# Patient Record
Sex: Female | Born: 1995 | Race: White | Hispanic: No | Smoking: Never smoker
Health system: Southern US, Community
[De-identification: ages and names within clinical notes are randomized; demographics above are authoritative.]

## PROBLEM LIST (undated history)

## (undated) DIAGNOSIS — Z23 Encounter for immunization: Secondary | ICD-10-CM

## (undated) DIAGNOSIS — J45909 Unspecified asthma, uncomplicated: Secondary | ICD-10-CM

## (undated) HISTORY — DX: Encounter for immunization: Z23

## (undated) NOTE — *Deleted (*Deleted)
I value your feedback and entrusting us with your care. If you get a [facility] patient survey, I would appreciate you taking the time to let us know about your experience today. Thank you!  As of February 05, 2019, your lab results will be released to your MyChart immediately, before I even have a chance to see them. Please give me time to review them and contact you if there are any abnormalities. Thank you for your patience.  

---
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
## 2016-07-17 ENCOUNTER — Encounter: Payer: Self-pay | Admitting: Obstetrics and Gynecology

## 2016-07-17 ENCOUNTER — Ambulatory Visit (INDEPENDENT_AMBULATORY_CARE_PROVIDER_SITE_OTHER): Payer: Medicaid Other | Admitting: Obstetrics and Gynecology

## 2016-07-17 VITALS — BP 124/70 | Ht 66.0 in | Wt 150.0 lb

## 2016-07-17 DIAGNOSIS — Z0000 Encounter for general adult medical examination without abnormal findings: Secondary | ICD-10-CM | POA: Diagnosis not present

## 2016-07-17 DIAGNOSIS — N938 Other specified abnormal uterine and vaginal bleeding: Secondary | ICD-10-CM

## 2016-07-17 DIAGNOSIS — Z3041 Encounter for surveillance of contraceptive pills: Secondary | ICD-10-CM

## 2016-07-17 DIAGNOSIS — Z3202 Encounter for pregnancy test, result negative: Secondary | ICD-10-CM

## 2016-07-17 DIAGNOSIS — Z113 Encounter for screening for infections with a predominantly sexual mode of transmission: Secondary | ICD-10-CM

## 2016-07-17 DIAGNOSIS — Z01419 Encounter for gynecological examination (general) (routine) without abnormal findings: Secondary | ICD-10-CM

## 2016-07-17 DIAGNOSIS — Z803 Family history of malignant neoplasm of breast: Secondary | ICD-10-CM

## 2016-07-17 LAB — POCT URINE PREGNANCY: PREG TEST UR: NEGATIVE

## 2016-07-17 MED ORDER — DROSPIRENONE-ETHINYL ESTRADIOL 3-0.02 MG PO TABS: 1.0000 | ORAL_TABLET | Freq: Every day | ORAL | 11 refills | Status: DC

## 2016-07-17 NOTE — Progress Notes (Signed)
Chief Complaint  Patient presents with  . Annual Exam     HPI:      [name] is a 21 y.o. G0P0000 who LMP was Patient's last menstrual period was 07/15/2016., presents today for her annual examination.  Her menses are regular every 28-30 days, lasting 7-9 days.  Dysmenorrhea moderate, occurring first 1-2 days of flow. She does not have intermenstrual bleeding usually but she tried continuous dosing of OCPs this past month and has had BTB since. No late/missed OCPs.  Sex activity: single partner, contraception - OCP (estrogen/progesterone).  Hx of STDs: none  There is a FH of breast cancer in her mat aunt, genetic testing not done. There is no FH of ovarian cancer. The patient does do self-breast exams.  Tobacco use: The patient denies current or previous tobacco use. Alcohol use: none Exercise: moderately active  She does get adequate calcium and Vitamin D in her diet.    Past Medical History:  Diagnosis Date  . Asthma     History reviewed. No pertinent surgical history.  Family History  Problem Relation Age of Onset  . Diabetes Mother   . Hypertension Mother   . Diabetes Maternal Grandmother   . Hypertension Maternal Grandmother   . Diabetes Maternal Grandfather   . Hypertension Maternal Grandfather   . Breast cancer Maternal Aunt        ? 3040s    Social History   Social History  . Marital status: Single    Spouse name: N/A  . Number of children: N/A  . Years of education: N/A   Occupational History  . Not on file.   Social History Main Topics  . Smoking status: Never Smoker  . Smokeless tobacco: Never Used  . Alcohol use No  . Drug use: No  . Sexual activity: Yes    Birth control/ protection: Pill   Other Topics Concern  . Not on file   Social History Narrative  . No narrative on file     Current Outpatient Prescriptions:  .  doxycycline (VIBRAMYCIN) 100 MG capsule, Take 1 capsule (100 mg total) by mouth 2 (two) times daily.,  Disp: 20 capsule, Rfl: 0 .  metroNIDAZOLE (FLAGYL) 500 MG tablet, Take 1 tablet (500 mg total) by mouth 2 (two) times daily., Disp: 20 tablet, Rfl: 0 .  drospirenone-ethinyl estradiol (YAZ,GIANVI,LORYNA) 3-0.02 MG tablet, Take 1 tablet by mouth daily., Disp: 1 Package, Rfl: 11  ROS:  Review of Systems  Constitutional: Negative for fatigue, fever and unexpected weight change.  Respiratory: Negative for cough, shortness of breath and wheezing.   Cardiovascular: Negative for chest pain, palpitations and leg swelling.  Gastrointestinal: Negative for blood in stool, constipation, diarrhea, nausea and vomiting.  Endocrine: Negative for cold intolerance, heat intolerance and polyuria.  Genitourinary: Positive for vaginal bleeding. Negative for dyspareunia, dysuria, flank pain, frequency, genital sores, hematuria, menstrual problem, pelvic pain, urgency, vaginal discharge and vaginal pain.  Musculoskeletal: Negative for back pain, joint swelling and myalgias.  Skin: Negative for rash.  Neurological: Negative for dizziness, syncope, light-headedness, numbness and headaches.  Hematological: Negative for adenopathy.  Psychiatric/Behavioral: Negative for agitation, confusion, sleep disturbance and suicidal ideas. The patient is not nervous/anxious.      Objective: BP 124/70   Ht 5\' 6"  (1.676 m)   Wt 150 lb (68 kg)   LMP 07/15/2016   BMI 24.21 kg/m    Physical Exam  Constitutional: She is oriented to person, place, and time. She appears well-developed  and well-nourished.  Genitourinary: Vagina normal and uterus normal. There is no rash or tenderness on the right labia. There is no rash or tenderness on the left labia. No erythema or tenderness in the vagina. No vaginal discharge found. Right adnexum does not display mass and does not display tenderness. Left adnexum does not display mass and does not display tenderness. Cervix does not exhibit motion tenderness or polyp. Uterus is not enlarged or  tender.  Neck: Normal range of motion. No thyromegaly present.  Cardiovascular: Normal rate, regular rhythm and normal heart sounds.   No murmur heard. Pulmonary/Chest: Effort normal and breath sounds normal. Right breast exhibits no mass, no nipple discharge, no skin change and no tenderness. Left breast exhibits no mass, no nipple discharge, no skin change and no tenderness.  Abdominal: Soft. There is no tenderness. There is no guarding.  Musculoskeletal: Normal range of motion.  Neurological: She is alert and oriented to person, place, and time. No cranial nerve deficit.  Psychiatric: She has a normal mood and affect. Her behavior is normal.  Vitals reviewed.   Results: Results for orders placed or performed in visit on 07/17/16 (from the past 24 hour(s))  POCT urine pregnancy     Status: Normal   Collection Time: 07/17/16  4:39 PM  Result Value Ref Range   Preg Test, Ur Negative Negative    Assessment/Plan: Encounter for annual routine gynecological examination  Screening for STD (sexually transmitted disease) - Plan: Chlamydia/Gonococcus/Trichomonas, NAA  Encounter for surveillance of contraceptive pills - OCP change due to long menses. Rx Yaz. F/u prn.  - Plan: drospirenone-ethinyl estradiol (YAZ,GIANVI,LORYNA) 3-0.02 MG tablet  Family history of breast cancer - May qualify for genetic testing when 22.  DUB (dysfunctional uterine bleeding) - Neg UPT. Most likely due to continuous dosing of OCPs. Change OCPs anyway. F/u prn - Plan: POCT urine pregnancy             GYN counsel use and side effects of OCP's, adequate intake of calcium and vitamin D, diet and exercise     F/U  Return in about 1 year (around 07/17/2017).  Thad Osoria B. Holleigh Crihfield, PA-C 07/17/2016 4:57 PM

## 2016-07-20 LAB — CHLAMYDIA/GONOCOCCUS/TRICHOMONAS, NAA
CHLAMYDIA BY NAA: NEGATIVE
GONOCOCCUS BY NAA: NEGATIVE
TRICH VAG BY NAA: NEGATIVE

## 2016-08-21 ENCOUNTER — Ambulatory Visit (INDEPENDENT_AMBULATORY_CARE_PROVIDER_SITE_OTHER): Payer: Medicaid Other | Admitting: Obstetrics and Gynecology

## 2016-08-21 ENCOUNTER — Encounter: Payer: Self-pay | Admitting: Obstetrics and Gynecology

## 2016-08-21 VITALS — BP 128/66 | HR 93 | Ht 66.0 in | Wt 153.0 lb

## 2016-08-21 DIAGNOSIS — B9689 Other specified bacterial agents as the cause of diseases classified elsewhere: Secondary | ICD-10-CM

## 2016-08-21 DIAGNOSIS — N926 Irregular menstruation, unspecified: Secondary | ICD-10-CM

## 2016-08-21 DIAGNOSIS — N760 Acute vaginitis: Secondary | ICD-10-CM | POA: Diagnosis not present

## 2016-08-21 LAB — POCT WET PREP WITH KOH
CLUE CELLS WET PREP PER HPF POC: POSITIVE
KOH PREP POC: POSITIVE — AB
TRICHOMONAS UA: NEGATIVE
YEAST WET PREP PER HPF POC: NEGATIVE

## 2016-08-21 LAB — POCT URINE PREGNANCY: PREG TEST UR: NEGATIVE

## 2016-08-21 MED ORDER — METRONIDAZOLE 500 MG PO TABS: ORAL_TABLET | ORAL | 0 refills | Status: DC

## 2016-08-21 NOTE — Progress Notes (Signed)
Chief Complaint  Patient presents with  . Vaginitis    Discharge, itching/odor  x few weeks/has tried monistat    HPI:      [name] is a 21 y.o. G0P0000 who LMP was Patient's last menstrual period was 07/23/2016., presents today for vaginal d/c, irritation, and odor for several wks. She treated with monistat-1 a week ago with temporary relief. She has been swimming a lot. No recent abx use, no new sex partners, no new soaps/detergents. She had neg gon/chlam at 5/18 annual.  She is also late for her period. She was having BTB when I saw her a month ago at her annual after doing continuous dosing of OCPs. She had a neg UPT then. We planned to try different OCPs anyway due to long menses and pt changed pills immediately. She is due to start placebo pills this wk. She had neg UPT last wk. No late/missed pills.   There are no active problems to display for this patient.   Family History  Problem Relation Age of Onset  . Diabetes Mother   . Hypertension Mother   . Diabetes Maternal Grandmother   . Hypertension Maternal Grandmother   . Diabetes Maternal Grandfather   . Hypertension Maternal Grandfather   . Breast cancer Maternal Aunt        ? 41s    Social History   Social History  . Marital status: Single    Spouse name: N/A  . Number of children: 0  . Years of education: 12   Occupational History  . Not on file.   Social History Main Topics  . Smoking status: Never Smoker  . Smokeless tobacco: Never Used  . Alcohol use No  . Drug use: No  . Sexual activity: Yes    Birth control/ protection: Pill   Other Topics Concern  . Not on file   Social History Narrative  . No narrative on file     Current Outpatient Prescriptions:  .  drospirenone-ethinyl estradiol (YAZ,GIANVI,LORYNA) 3-0.02 MG tablet, Take 1 tablet by mouth daily., Disp: 1 Package, Rfl: 11 .  metroNIDAZOLE (FLAGYL) 500 MG tablet, Take 2 tabs BID for 1 day, Disp: 4 tablet, Rfl: 0  Review of  Systems  Constitutional: Negative for fever.  Gastrointestinal: Positive for constipation. Negative for blood in stool, diarrhea, nausea and vomiting.  Genitourinary: Negative for dyspareunia, dysuria, flank pain, frequency, hematuria, urgency, vaginal bleeding, vaginal discharge and vaginal pain.  Musculoskeletal: Negative for back pain.  Skin: Negative for rash.     OBJECTIVE:   Vitals:  BP 128/66 (BP Location: Left Arm, Patient Position: Sitting, Cuff Size: Normal)   Pulse 93   Ht 5\' 6"  (1.676 m)   Wt 153 lb (69.4 kg)   LMP 07/23/2016   BMI 24.69 kg/m   Physical Exam  Constitutional: She is oriented to person, place, and time and well-developed, well-nourished, and in no distress. Vital signs are normal.  Genitourinary: Uterus normal, cervix normal, right adnexa normal, left adnexa normal and vulva normal. Uterus is not enlarged. Cervix exhibits no motion tenderness and no tenderness. Right adnexum displays no mass and no tenderness. Left adnexum displays no mass and no tenderness. Vulva exhibits no erythema, no exudate, no lesion, no rash and no tenderness. Vagina exhibits no lesion. Thin  fishy  white and vaginal discharge found.  Neurological: She is oriented to person, place, and time.  Vitals reviewed.   Results: Results for orders placed or performed in visit on  08/21/16 (from the past 24 hour(s))  POCT urine pregnancy     Status: Normal   Collection Time: 08/21/16  9:57 AM  Result Value Ref Range   Preg Test, Ur Negative Negative  POCT Wet Prep with KOH     Status: Abnormal   Collection Time: 08/21/16 10:08 AM  Result Value Ref Range   Trichomonas, UA Negative    Clue Cells Wet Prep HPF POC pos    Epithelial Wet Prep HPF POC  Few, Moderate, Many, Too numerous to count   Yeast Wet Prep HPF POC neg    Bacteria Wet Prep HPF POC  Few   RBC Wet Prep HPF POC     WBC Wet Prep HPF POC     KOH Prep POC Positive (A) Negative     Assessment/Plan: Bacterial vaginosis -  Rx flagyl eRxd. Will RF if sx recur. F/u prn. - Plan: POCT Wet Prep with KOH, metroNIDAZOLE (FLAGYL) 500 MG tablet  Late menses - Neg UPT. Reassurance. Pt just changed OCPs. Due to start placebo pills this wk. F/u prn. - Plan: POCT urine pregnancy     Meds ordered this encounter  Medications  . metroNIDAZOLE (FLAGYL) 500 MG tablet    Sig: Take 2 tabs BID for 1 day    Dispense:  4 tablet    Refill:  0      Return if symptoms worsen or fail to improve.  Alicia B. Copland, PA-C 08/21/2016 10:08 AM

## 2016-09-06 ENCOUNTER — Telehealth: Payer: Self-pay

## 2016-09-06 ENCOUNTER — Other Ambulatory Visit: Payer: Self-pay | Admitting: Obstetrics and Gynecology

## 2016-09-06 DIAGNOSIS — N760 Acute vaginitis: Principal | ICD-10-CM

## 2016-09-06 DIAGNOSIS — B9689 Other specified bacterial agents as the cause of diseases classified elsewhere: Secondary | ICD-10-CM

## 2016-09-06 MED ORDER — METRONIDAZOLE 500 MG PO TABS: ORAL_TABLET | ORAL | 0 refills | Status: DC

## 2016-09-06 NOTE — Telephone Encounter (Signed)
Rx eRxd.  

## 2016-09-06 NOTE — Telephone Encounter (Signed)
Pt called triage line stating she needs a refill of the Metronidazole. She was seen on 6/26 and ABC said  She would call it in if needed. [facility] pharmacy.

## 2016-10-02 ENCOUNTER — Emergency Department: Payer: Medicaid Other

## 2016-10-02 ENCOUNTER — Emergency Department
Admission: EM | Admit: 2016-10-02 | Discharge: 2016-10-02 | Disposition: A | Discharge status: Home / Self Care | Payer: Medicaid Other | Attending: Emergency Medicine | Admitting: Emergency Medicine

## 2016-10-02 DIAGNOSIS — R0789 Other chest pain: Secondary | ICD-10-CM | POA: Insufficient documentation

## 2016-10-02 DIAGNOSIS — R079 Chest pain, unspecified: Secondary | ICD-10-CM | POA: Diagnosis present

## 2016-10-02 LAB — COMPREHENSIVE METABOLIC PANEL
ALT: 20 U/L (ref 14–54)
AST: 22 U/L (ref 15–41)
Albumin: 4.7 g/dL (ref 3.5–5.0)
Alkaline Phosphatase: 41 U/L (ref 38–126)
Anion gap: 9 (ref 5–15)
BUN: 13 mg/dL (ref 6–20)
CHLORIDE: 106 mmol/L (ref 101–111)
CO2: 22 mmol/L (ref 22–32)
Calcium: 9.6 mg/dL (ref 8.9–10.3)
Creatinine, Ser: 1.01 mg/dL — ABNORMAL HIGH (ref 0.44–1.00)
Glucose, Bld: 107 mg/dL — ABNORMAL HIGH (ref 65–99)
POTASSIUM: 3.4 mmol/L — AB (ref 3.5–5.1)
Sodium: 137 mmol/L (ref 135–145)
Total Bilirubin: 0.5 mg/dL (ref 0.3–1.2)
Total Protein: 7.8 g/dL (ref 6.5–8.1)

## 2016-10-02 LAB — CBC
HCT: 40.9 % (ref 35.0–47.0)
Hemoglobin: 14 g/dL (ref 12.0–16.0)
MCH: 31 pg (ref 26.0–34.0)
MCHC: 34.3 g/dL (ref 32.0–36.0)
MCV: 90.5 fL (ref 80.0–100.0)
PLATELETS: 299 10*3/uL (ref 150–440)
RBC: 4.52 MIL/uL (ref 3.80–5.20)
RDW: 13.2 % (ref 11.5–14.5)
WBC: 7.2 10*3/uL (ref 3.6–11.0)

## 2016-10-02 LAB — FIBRIN DERIVATIVES D-DIMER (ARMC ONLY): FIBRIN DERIVATIVES D-DIMER (ARMC): 75.34 (ref 0.00–499.00)

## 2016-10-02 LAB — TROPONIN I

## 2016-10-02 MED ORDER — KETOROLAC TROMETHAMINE 30 MG/ML IJ SOLN
15.0000 mg | INTRAMUSCULAR | Status: AC
  Administered 2016-10-02: 15 mg via INTRAVENOUS
  Filled 2016-10-02: qty 1

## 2016-10-02 MED ORDER — NAPROXEN 500 MG PO TABS: 500.0000 mg | ORAL_TABLET | Freq: Two times a day (BID) | ORAL | 0 refills | Status: DC

## 2016-10-02 NOTE — ED Provider Notes (Signed)
[facility] Emergency Department Provider Note  ____________________________________________  Time seen: Approximately 10:12 PM  I have reviewed the triage vital signs and the nursing notes.   HISTORY  Chief Complaint Chest Pain    HPI Jodi Chapman is a 21 y.o. female who complains of central chest pain radiating to the back which is described as sharp and worse with breathing. Not exertional, no cough. No dizziness vomiting or diaphoresis. No syncope. Rapid onset while at work at about 4 PM today, somewhat intermittent and waxing and waning in intensity over the next few hours butat times severe. No recent travel, hospitalization or surgery. No history of DVT or PE. Nonsmoker. Takes oral birth control pills.  No pain or paresthesias or weakness in any extremities. No neck pain headache vision changes. No dizziness     Past Medical History:  Diagnosis Date  . Asthma   . Immunization, viral disease    GARDASIL SERIES COMPLETE     There are no active problems to display for this patient.    Past Surgical History:  Procedure Laterality Date  . WISDOM TOOTH EXTRACTION  03/29/2014     Prior to Admission medications   Medication Sig Start Date End Date Taking? Authorizing Provider  drospirenone-ethinyl estradiol (YAZ,GIANVI,LORYNA) 3-0.02 MG tablet Take 1 tablet by mouth daily. 07/17/16   Copland, Ilona Sorrel, PA-C  metroNIDAZOLE (FLAGYL) 500 MG tablet Take 1 tab BID for 7 days 09/06/16   Copland, Ilona Sorrel, PA-C     Allergies Patient has no known allergies.   Family History  Problem Relation Age of Onset  . Diabetes Mother   . Hypertension Mother   . Diabetes Maternal Grandmother   . Hypertension Maternal Grandmother   . Diabetes Maternal Grandfather   . Hypertension Maternal Grandfather   . Breast cancer Maternal Aunt        ? 49s    Social History Social History  Substance Use Topics  . Smoking status: Never Smoker  . Smokeless  tobacco: Never Used  . Alcohol use No    Review of Systems  Constitutional:   No fever or chills.  ENT:   No sore throat. No rhinorrhea. Cardiovascular:   Positive as above chest pain without syncope. Respiratory:   No dyspnea or cough. Gastrointestinal:   Negative for abdominal pain, vomiting and diarrhea.  Musculoskeletal:   Negative for focal pain or swelling All other systems reviewed and are negative except as documented above in ROS and HPI.  ____________________________________________   PHYSICAL EXAM:  VITAL SIGNS: ED Triage Vitals  Enc Vitals Group     BP 10/02/16 2025 133/80     Pulse Rate 10/02/16 2025 75     Resp 10/02/16 2025 20     Temp 10/02/16 2025 98.6 F (37 C)     Temp Source 10/02/16 2025 Oral     SpO2 10/02/16 2025 99 %     Weight 10/02/16 2025 150 lb (68 kg)     Height 10/02/16 2025 5\' 6"  (1.676 m)     Head Circumference --      Peak Flow --      Pain Score 10/02/16 2027 7     Pain Loc --      Pain Edu? --      Excl. in GC? --     Vital signs reviewed, nursing assessments reviewed.   Constitutional:   Alert and oriented. Well appearing and in no distress. Eyes:   No scleral icterus.  EOMI. No nystagmus. No conjunctival pallor. PERRL. ENT   Head:   Normocephalic and atraumatic.   Nose:   No congestion/rhinnorhea.    Mouth/Throat:   MMM, no pharyngeal erythema. No peritonsillar mass.    Neck:   No meningismus. Full ROM Hematological/Lymphatic/Immunilogical:   No cervical lymphadenopathy. Cardiovascular:   RRR. Symmetric bilateral radial and DP pulses.  No murmurs.  Respiratory:   Normal respiratory effort without tachypnea/retractions. Breath sounds are clear and equal bilaterally. No wheezes/rales/rhonchi. Gastrointestinal:   Soft and nontender. Non distended. There is no CVA tenderness.  No rebound, rigidity, or guarding. Genitourinary:   deferred Musculoskeletal:   Normal range of motion in all extremities. No joint effusions.   No lower extremity tenderness.  No edema.Chest wall tender over the upper sternum, corresponding to the area of indicated pain. Neurologic:   Normal speech and language.  Motor grossly intact. No gross focal neurologic deficits are appreciated.  Skin:    Skin is warm, dry and intact. No rash noted.  No petechiae, purpura, or bullae.  ____________________________________________    LABS (pertinent positives/negatives) (all labs ordered are listed, but only abnormal results are displayed) Labs Reviewed  COMPREHENSIVE METABOLIC PANEL - Abnormal; Notable for the following:       Result Value   Potassium 3.4 (*)    Glucose, Bld 107 (*)    Creatinine, Ser 1.01 (*)    All other components within normal limits  CBC  TROPONIN I  FIBRIN DERIVATIVES D-DIMER ([facility] ONLY)   ____________________________________________   EKG  Interpreted by me  Date: 10/02/2016  Rate: 78  Rhythm: normal sinus rhythm  QRS Axis: normal  Intervals: normal  ST/T Wave abnormalities: normal  Conduction Disutrbances: none  Narrative Interpretation: unremarkable      ____________________________________________    RADIOLOGY  Dg Chest 2 View  Result Date: 10/02/2016 CLINICAL DATA:  Chest pain EXAM: CHEST  2 VIEW COMPARISON:  None. FINDINGS: Lungs are clear. Heart size and pulmonary vascularity are normal. No adenopathy. No pneumothorax. No bone lesions. IMPRESSION: No abnormality noted. Electronically Signed   By: Bretta BangWilliam  Woodruff III M.D.   On: 10/02/2016 20:49    ____________________________________________   PROCEDURES Procedures  ____________________________________________   INITIAL IMPRESSION / ASSESSMENT AND PLAN / ED COURSE  Pertinent labs & imaging results that were available during my care of the patient were reviewed by me and considered in my medical decision making (see chart for details).  Patient well appearing no acute distress, presents with pleuritic chest pain, somewhat  reproducible on exam. Chest wall pain versus low suspicion for pulmonary embolism. D-dimer negative and actually very low. Otherwise labs chest x-ray and vitals are all unremarkable. With normal vital signs including no hypoxia or tachycardia, very unlikely the patient would have a PE. I highly doubt an aortic injury such as dissection. No evidence of pneumothorax on the ACS or pericarditis. We'll recommend NSAIDs for pain control, follow up with primary care.      ____________________________________________   FINAL CLINICAL IMPRESSION(S) / ED DIAGNOSES  Final diagnoses:  Atypical chest pain      New Prescriptions   No medications on file     Portions of this note were generated with dragon dictation software. Dictation errors may occur despite best attempts at proofreading.    Sharman CheekStafford, Aswad Wandrey, MD 10/02/16 2216

## 2016-10-02 NOTE — ED Triage Notes (Signed)
Pt in with co midsternal chest pain that radiates to back. Denies any shob but does states pain worse on inspiration. No hx of the same, or known injury;.

## 2017-01-25 ENCOUNTER — Ambulatory Visit
Admission: EM | Admit: 2017-01-25 | Discharge: 2017-01-25 | Disposition: A | Discharge status: Home / Self Care | Payer: Medicaid Other | Attending: Family Medicine | Admitting: Family Medicine

## 2017-01-25 ENCOUNTER — Encounter: Payer: Self-pay | Admitting: *Deleted

## 2017-01-25 DIAGNOSIS — R51 Headache: Secondary | ICD-10-CM | POA: Diagnosis not present

## 2017-01-25 DIAGNOSIS — J069 Acute upper respiratory infection, unspecified: Secondary | ICD-10-CM | POA: Diagnosis not present

## 2017-01-25 DIAGNOSIS — J029 Acute pharyngitis, unspecified: Secondary | ICD-10-CM | POA: Diagnosis not present

## 2017-01-25 DIAGNOSIS — R0981 Nasal congestion: Secondary | ICD-10-CM | POA: Diagnosis present

## 2017-01-25 LAB — RAPID STREP SCREEN (MED CTR MEBANE ONLY): STREPTOCOCCUS, GROUP A SCREEN (DIRECT): NEGATIVE

## 2017-01-25 MED ORDER — AMOXICILLIN-POT CLAVULANATE 875-125 MG PO TABS: 1.0000 | ORAL_TABLET | Freq: Two times a day (BID) | ORAL | 0 refills | Status: DC

## 2017-01-25 NOTE — ED Triage Notes (Signed)
Headache, facial pain nasal drainage- bloody, sore throat, x2 days. Seen here for URI 3 weeks ago and did not completely recover. OTC meds not working.

## 2017-01-25 NOTE — ED Provider Notes (Signed)
[facility]    CSN: [id number] Arrival date & time: 01/25/17  1705     History   Chief Complaint Chief Complaint  Patient presents with  . Headache  . Facial Pain  . Sore Throat  . Nasal Congestion    HPI Jodi Chapman is a 21 y.o. female.   HPI  21 year old female accompanied by her mother presents with headache facial pain with nasal drainage sore throat for 2 days.  She was seen for a URI about 3 weeks ago and did not completely recover only to have worsened over the last couple days.  Using DayQuil and NyQuil without much success.  She does not complain specifically of a cough but more of a sinusitis type of symptoms.  States that she has been waking up in bed clothing very wet and having chills at nighttime but has not measured her fever.  She does not have a fever today.       Past Medical History:  Diagnosis Date  . Asthma   . Immunization, viral disease    GARDASIL SERIES COMPLETE    There are no active problems to display for this patient.   Past Surgical History:  Procedure Laterality Date  . WISDOM TOOTH EXTRACTION  03/29/2014    OB History    Gravida Para Term Preterm AB Living   0 0 0 0 0 0   SAB TAB Ectopic Multiple Live Births   0 0 0 0 0       Home Medications    Prior to Admission medications   Medication Sig Start Date End Date Taking? Authorizing Provider  drospirenone-ethinyl estradiol (YAZ,GIANVI,LORYNA) 3-0.02 MG tablet Take 1 tablet by mouth daily. 07/17/16  Yes Copland, Alicia B, PA-C  amoxicillin-clavulanate (AUGMENTIN) 875-125 MG tablet Take 1 tablet by mouth every 12 (twelve) hours. 01/25/17   Lutricia Feiloemer, Sherrol Vicars P, PA-C    Family History Family History  Problem Relation Age of Onset  . Diabetes Mother   . Hypertension Mother   . Diabetes Maternal Grandmother   . Hypertension Maternal Grandmother   . Diabetes Maternal Grandfather   . Hypertension Maternal Grandfather   . Breast cancer Maternal Aunt    ? 4740s    Social History Social History   Tobacco Use  . Smoking status: Never Smoker  . Smokeless tobacco: Never Used  Substance Use Topics  . Alcohol use: No  . Drug use: No     Allergies   Patient has no known allergies.   Review of Systems Review of Systems  Constitutional: Positive for activity change, chills and fatigue. Negative for fever.  HENT: Positive for congestion.   All other systems reviewed and are negative.    Physical Exam Triage Vital Signs ED Triage Vitals  Enc Vitals Group     BP 01/25/17 1809 129/80     Pulse Rate 01/25/17 1809 94     Resp 01/25/17 1809 16     Temp 01/25/17 1809 98.2 F (36.8 C)     Temp Source 01/25/17 1809 Oral     SpO2 01/25/17 1809 100 %     Weight 01/25/17 1811 130 lb (59 kg)     Height 01/25/17 1811 5\' 7"  (1.702 m)     Head Circumference --      Peak Flow --      Pain Score --      Pain Loc --      Pain Edu? --      Excl.  in GC? --    No data found.  Updated Vital Signs BP 129/80 (BP Location: Left Arm)   Pulse 94   Temp 98.2 F (36.8 C) (Oral)   Resp 16   Ht 5\' 7"  (1.702 m)   Wt 130 lb (59 kg)   SpO2 100%   BMI 20.36 kg/m   Visual Acuity Right Eye Distance:   Left Eye Distance:   Bilateral Distance:    Right Eye Near:   Left Eye Near:    Bilateral Near:     Physical Exam  Constitutional: She is oriented to person, place, and time. She appears well-developed and well-nourished.  Non-toxic appearance. She does not appear ill. No distress.  HENT:  Head: Normocephalic.  Mouth/Throat: Oropharynx is clear and moist.  Eyes: Pupils are equal, round, and reactive to light.  Neck: Normal range of motion.  Pulmonary/Chest: Effort normal and breath sounds normal.  Musculoskeletal: Normal range of motion.  Lymphadenopathy:    She has cervical adenopathy.  Neurological: She is alert and oriented to person, place, and time. She has normal strength.  Skin: Skin is warm and dry.  Psychiatric: She has a  normal mood and affect. Her behavior is normal.  Nursing note and vitals reviewed.    UC Treatments / Results  Labs (all labs ordered are listed, but only abnormal results are displayed) Labs Reviewed  RAPID STREP SCREEN (NOT [facility])  CULTURE, GROUP A STREP [facility])    EKG  EKG Interpretation None       Radiology No results found.  Procedures Procedures (including critical care time)  Medications Ordered in UC Medications - No data to display   Initial Impression / Assessment and Plan / UC Course  I have reviewed the triage vital signs and the nursing notes.  Pertinent labs & imaging results that were available during my care of the patient were reviewed by me and considered in my medical decision making (see chart for details).     Plan: 1. Test/x-ray results and diagnosis reviewed with patient 2. rx as per orders; risks, benefits, potential side effects reviewed with patient 3. Recommend supportive treatment with flonase nasal  spray daily.  Technique was described to the patient.  Because of her length of her symptoms and a worsening recently suggestive sinusitis.  Also recommended Tylenol or Motrin for chills fever or pain.  She is not improving she should follow-up with her primary care physician. 4. F/u prn if symptoms worsen or don't improve   Final Clinical Impressions(s) / UC Diagnoses   Final diagnoses:  Upper respiratory tract infection, unspecified type    ED Discharge Orders        Ordered    amoxicillin-clavulanate (AUGMENTIN) 875-125 MG tablet  Every 12 hours     01/25/17 1845       Controlled Substance Prescriptions [place] Controlled Substance Registry consulted? Not Applicable   Lutricia FeilRoemer, Kalanie Fewell P, PA-C 01/25/17 1858

## 2017-01-28 LAB — CULTURE, GROUP A STREP (THRC)

## 2017-04-08 ENCOUNTER — Emergency Department
Admission: EM | Admit: 2017-04-08 | Discharge: 2017-04-09 | Disposition: A | Discharge status: Home / Self Care | Payer: Medicaid Other | Attending: Emergency Medicine | Admitting: Emergency Medicine

## 2017-04-08 ENCOUNTER — Other Ambulatory Visit: Payer: Self-pay

## 2017-04-08 ENCOUNTER — Encounter: Payer: Self-pay | Admitting: Emergency Medicine

## 2017-04-08 DIAGNOSIS — I880 Nonspecific mesenteric lymphadenitis: Secondary | ICD-10-CM | POA: Insufficient documentation

## 2017-04-08 DIAGNOSIS — R109 Unspecified abdominal pain: Secondary | ICD-10-CM | POA: Diagnosis present

## 2017-04-08 DIAGNOSIS — J45909 Unspecified asthma, uncomplicated: Secondary | ICD-10-CM | POA: Insufficient documentation

## 2017-04-08 DIAGNOSIS — N200 Calculus of kidney: Secondary | ICD-10-CM | POA: Diagnosis not present

## 2017-04-08 DIAGNOSIS — Z79899 Other long term (current) drug therapy: Secondary | ICD-10-CM | POA: Insufficient documentation

## 2017-04-08 NOTE — ED Triage Notes (Signed)
Patient ambulatory to triage with steady gait, without difficulty, appears uncomfortable, grimacing; pt reports right flank pain radiating into abd tonight; denies hx of same; st urine brown in color

## 2017-04-08 NOTE — ED Notes (Signed)
Pt reports unable to void at this time.

## 2017-04-09 ENCOUNTER — Emergency Department: Payer: Medicaid Other

## 2017-04-09 LAB — BASIC METABOLIC PANEL
ANION GAP: 12 (ref 5–15)
BUN: 13 mg/dL (ref 6–20)
CALCIUM: 9 mg/dL (ref 8.9–10.3)
CO2: 21 mmol/L — AB (ref 22–32)
CREATININE: 0.76 mg/dL (ref 0.44–1.00)
Chloride: 105 mmol/L (ref 101–111)
GFR calc Af Amer: >60 mL/min (ref >60)
GLUCOSE: 102 mg/dL — AB (ref 65–99)
Potassium: 3.4 mmol/L — ABNORMAL LOW (ref 3.5–5.1)
Sodium: 138 mmol/L (ref 135–145)

## 2017-04-09 LAB — URINALYSIS, COMPLETE (UACMP) WITH MICROSCOPIC
Bilirubin Urine: NEGATIVE
GLUCOSE, UA: NEGATIVE mg/dL
Ketones, ur: NEGATIVE mg/dL
Leukocytes, UA: NEGATIVE
NITRITE: NEGATIVE
PROTEIN: 30 mg/dL — AB
SPECIFIC GRAVITY, URINE: 1.018 (ref 1.005–1.030)
pH: 6 (ref 5.0–8.0)

## 2017-04-09 LAB — CBC WITH DIFFERENTIAL/PLATELET
BASOS PCT: 0 %
Basophils Absolute: 0 10*3/uL (ref 0–0.1)
EOS ABS: 0.1 10*3/uL (ref 0–0.7)
EOS PCT: 2 %
HEMATOCRIT: 38.5 % (ref 35.0–47.0)
Hemoglobin: 13.2 g/dL (ref 12.0–16.0)
Lymphocytes Relative: 44 %
Lymphs Abs: 4.1 10*3/uL — ABNORMAL HIGH (ref 1.0–3.6)
MCH: 30.3 pg (ref 26.0–34.0)
MCHC: 34.3 g/dL (ref 32.0–36.0)
MCV: 88.3 fL (ref 80.0–100.0)
MONO ABS: 0.8 10*3/uL (ref 0.2–0.9)
Monocytes Relative: 8 %
NEUTROS ABS: 4.3 10*3/uL (ref 1.4–6.5)
Neutrophils Relative %: 46 %
PLATELETS: 275 10*3/uL (ref 150–440)
RBC: 4.36 MIL/uL (ref 3.80–5.20)
RDW: 12.8 % (ref 11.5–14.5)
WBC: 9.3 10*3/uL (ref 3.6–11.0)

## 2017-04-09 LAB — POCT PREGNANCY, URINE: PREG TEST UR: NEGATIVE

## 2017-04-09 MED ORDER — MORPHINE SULFATE (PF) 4 MG/ML IV SOLN
INTRAVENOUS | Status: AC
  Filled 2017-04-09: qty 1

## 2017-04-09 MED ORDER — KETOROLAC TROMETHAMINE 10 MG PO TABS: 10.0000 mg | ORAL_TABLET | Freq: Four times a day (QID) | ORAL | 0 refills | Status: DC | PRN

## 2017-04-09 MED ORDER — ONDANSETRON HCL 4 MG/2ML IJ SOLN
INTRAMUSCULAR | Status: AC
  Filled 2017-04-09: qty 2

## 2017-04-09 MED ORDER — SODIUM CHLORIDE 0.9 % IV BOLUS (SEPSIS)
1000.0000 mL | Freq: Once | INTRAVENOUS | Status: AC
  Administered 2017-04-09: 1000 mL via INTRAVENOUS

## 2017-04-09 MED ORDER — ONDANSETRON HCL 4 MG/2ML IJ SOLN
4.0000 mg | Freq: Once | INTRAMUSCULAR | Status: AC
  Administered 2017-04-09: 4 mg via INTRAVENOUS

## 2017-04-09 MED ORDER — MORPHINE SULFATE (PF) 4 MG/ML IV SOLN
4.0000 mg | Freq: Once | INTRAVENOUS | Status: AC
  Administered 2017-04-09: 4 mg via INTRAVENOUS

## 2017-04-09 MED ORDER — KETOROLAC TROMETHAMINE 30 MG/ML IJ SOLN
30.0000 mg | Freq: Once | INTRAMUSCULAR | Status: AC
  Administered 2017-04-09: 30 mg via INTRAVENOUS
  Filled 2017-04-09: qty 1

## 2017-04-09 NOTE — ED Provider Notes (Signed)
[facility] Emergency Department Provider Note _   First MD Initiated Contact with Patient 04/09/17 0030     (approximate)  I have reviewed the triage vital signs and the nursing notes.   HISTORY  Chief Complaint Flank Pain   HPI Jodi Chapman is a 22 y.o. female presents to the emergency department with acute onset of right flank pain radiating diffusely onto the abdomen which began tonight.  Patient denies any vomiting or diarrhea.  Patient does admit to nausea.  Patient denies any fever.  Patient denies any urinary symptoms.   Past Medical History:  Diagnosis Date  . Asthma   . Immunization, viral disease    GARDASIL SERIES COMPLETE    There are no active problems to display for this patient.   Past Surgical History:  Procedure Laterality Date  . WISDOM TOOTH EXTRACTION  03/29/2014    Prior to Admission medications   Medication Sig Start Date End Date Taking? Authorizing Provider  amoxicillin-clavulanate (AUGMENTIN) 875-125 MG tablet Take 1 tablet by mouth every 12 (twelve) hours. 01/25/17   Lutricia Feiloemer, William P, PA-C  drospirenone-ethinyl estradiol Pierre Bali(YAZ,GIANVI,LORYNA) 3-0.02 MG tablet Take 1 tablet by mouth daily. 07/17/16   Copland, Ilona SorrelAlicia B, PA-C  ketorolac (TORADOL) 10 MG tablet Take 1 tablet (10 mg total) by mouth every 6 (six) hours as needed. 04/09/17   Darci CurrentBrown, Velva N, MD    Allergies No known drug allergies  Family History  Problem Relation Age of Onset  . Diabetes Mother   . Hypertension Mother   . Diabetes Maternal Grandmother   . Hypertension Maternal Grandmother   . Diabetes Maternal Grandfather   . Hypertension Maternal Grandfather   . Breast cancer Maternal Aunt        ? 3440s    Social History Social History   Tobacco Use  . Smoking status: Never Smoker  . Smokeless tobacco: Never Used  Substance Use Topics  . Alcohol use: No  . Drug use: No    Review of Systems Constitutional: No fever/chills Eyes: No  visual changes. ENT: No sore throat. Cardiovascular: Denies chest pain. Respiratory: Denies shortness of breath. Gastrointestinal: Positive for abdominal pain and nausea no vomiting.  No diarrhea.  No constipation. Genitourinary: Negative for dysuria. Musculoskeletal: Negative for neck pain.  Negative for back pain. Integumentary: Negative for rash. Neurological: Negative for headaches, focal weakness or numbness.  ____________________________________________   PHYSICAL EXAM:  VITAL SIGNS: ED Triage Vitals  Enc Vitals Group     BP 04/08/17 2322 132/82     Pulse Rate 04/08/17 2322 92     Resp 04/08/17 2322 20     Temp 04/08/17 2322 97.6 F (36.4 C)     Temp Source 04/08/17 2322 Oral     SpO2 04/08/17 2322 99 %     Weight 04/08/17 2323 70.3 kg (155 lb)     Height 04/08/17 2323 1.676 m (5\' 6" )     Head Circumference --      Peak Flow --      Pain Score 04/08/17 2323 7     Pain Loc --      Pain Edu? --      Excl. in GC? --     Constitutional: Alert and oriented.  Apparent discomfort Eyes: Conjunctivae are normal. Head: Atraumatic. Mouth/Throat: Mucous membranes are moist. Oropharynx non-erythematous. Neck: No stridor.   Cardiovascular: Normal rate, regular rhythm. Good peripheral circulation. Grossly normal heart sounds. Respiratory: Normal respiratory effort.  No retractions. Lungs  CTAB. Gastrointestinal: Soft and nontender. No distention.  Musculoskeletal: No lower extremity tenderness nor edema. No gross deformities of extremities. Neurologic:  Normal speech and language. No gross focal neurologic deficits are appreciated.  Skin:  Skin is warm, dry and intact. No rash noted. Psychiatric: Mood and affect are normal. Speech and behavior are normal.  ____________________________________________   LABS (all labs ordered are listed, but only abnormal results are displayed)  Labs Reviewed  URINALYSIS, COMPLETE (UACMP) WITH MICROSCOPIC - Abnormal; Notable for the  following components:      Result Value   Color, Urine YELLOW (*)    APPearance HAZY (*)    Hgb urine dipstick SMALL (*)    Protein, ur 30 (*)    Bacteria, UA RARE (*)    Squamous Epithelial / LPF 0-5 (*)    All other components within normal limits  BASIC METABOLIC PANEL - Abnormal; Notable for the following components:   Potassium 3.4 (*)    CO2 21 (*)    Glucose, Bld 102 (*)    All other components within normal limits  CBC WITH DIFFERENTIAL/PLATELET - Abnormal; Notable for the following components:   Lymphs Abs 4.1 (*)    All other components within normal limits  POCT PREGNANCY, URINE   ______________________________________ RADIOLOGY I, [place] Kepler Mccabe, personally viewed and evaluated these images (plain radiographs) as part of my medical decision making, as well as reviewing the written report by the radiologist.    Official radiology report(s): Ct Renal Stone Study  Result Date: 04/09/2017 CLINICAL DATA:  Acute onset of right flank pain radiating into the abdomen. EXAM: CT ABDOMEN AND PELVIS WITHOUT CONTRAST TECHNIQUE: Multidetector CT imaging of the abdomen and pelvis was performed following the standard protocol without IV contrast. COMPARISON:  Pelvic ultrasound performed 08/18/2015 FINDINGS: Lower chest: The visualized lung bases are grossly clear. The visualized portions of the mediastinum are unremarkable. Hepatobiliary: The liver is unremarkable in appearance. The gallbladder is unremarkable in appearance. The common bile duct remains normal in caliber. Pancreas: The pancreas is within normal limits. Spleen: The spleen is unremarkable in appearance. Adrenals/Urinary Tract: The adrenal glands are unremarkable in appearance. The kidneys are within normal limits. There is no evidence of hydronephrosis. No renal or ureteral stones are identified. No perinephric stranding is seen. Stomach/Bowel: The stomach is unremarkable in appearance. The small bowel is within normal limits.  The appendix is not visualized; there is no evidence for appendicitis. The colon is unremarkable in appearance. Vascular/Lymphatic: The abdominal aorta is unremarkable in appearance. The inferior vena cava is grossly unremarkable. No retroperitoneal lymphadenopathy is seen. No pelvic sidewall lymphadenopathy is identified. Mildly prominent mesenteric nodes are noted. Reproductive: The bladder is mildly distended and grossly unremarkable. The uterus is unremarkable in appearance. The ovaries are relatively symmetric. No suspicious adnexal masses are seen. Other: No additional soft tissue abnormalities are seen. Musculoskeletal: No acute osseous abnormalities are identified. The visualized musculature is unremarkable in appearance. IMPRESSION: Mildly prominent mesenteric nodes could reflect mesenteric adenitis. Otherwise unremarkable noncontrast CT of the abdomen and pelvis. Electronically Signed   By: Roanna Raider M.D.   On: 04/09/2017 02:36     Procedures   ____________________________________________   INITIAL IMPRESSION / ASSESSMENT AND PLAN / ED COURSE  As part of my medical decision making, I reviewed the following data within the electronic MEDICAL RECORD 59 year old female presented with above-stated history of flank pain with radiation to the abdomen.  Concern for possible kidney stone versus cholelithiasis versus appendicitis.  As  such CT scan of the abdomen was performed which revealed mildly prominent mesenteric lymph nodes potentially secondary to mesenteric adenitis.  Patient given IV morphine with minimal improvement of pain and subsequently given IV Toradol with resolution of pain.  As such suspect mesenteric adenitis as etiology for the patient's discomfort.  Clinical Course as of Apr 10 343  Tue Apr 09, 2017  0242 RBC / HPF: 6-30 [RB]  0243 WBC, UA: 6-30 [RB]  0243 Bacteria, UA: (!) RARE [RB]  0243 Hgb urine dipstick: (!) SMALL [RB]    Clinical Course User Index [RB] Darci Current, MD    ____________________________________________  FINAL CLINICAL IMPRESSION(S) / ED DIAGNOSES  Final diagnoses:  Kidney stone on left side     MEDICATIONS GIVEN DURING THIS VISIT:  Medications  ondansetron (ZOFRAN) 4 MG/2ML injection (not administered)  morphine 4 MG/ML injection (not administered)  ondansetron (ZOFRAN) injection 4 mg (4 mg Intravenous Given 04/09/17 0100)  morphine 4 MG/ML injection 4 mg (4 mg Intravenous Given 04/09/17 0100)  sodium chloride 0.9 % bolus 1,000 mL (0 mLs Intravenous Stopped 04/09/17 0253)  ketorolac (TORADOL) 30 MG/ML injection 30 mg (30 mg Intravenous Given 04/09/17 0252)     ED Discharge Orders        Ordered    ketorolac (TORADOL) 10 MG tablet  Every 6 hours PRN     04/09/17 0345       Note:  This document was prepared using Dragon voice recognition software and may include unintentional dictation errors.    Darci Current, MD 04/09/17 [phone]

## 2017-04-09 NOTE — ED Notes (Signed)
Patient in CT

## 2017-07-18 ENCOUNTER — Ambulatory Visit: Payer: Self-pay | Admitting: Obstetrics and Gynecology

## 2017-07-22 ENCOUNTER — Other Ambulatory Visit: Payer: Self-pay | Admitting: Obstetrics and Gynecology

## 2017-07-22 DIAGNOSIS — Z3041 Encounter for surveillance of contraceptive pills: Secondary | ICD-10-CM

## 2017-07-23 ENCOUNTER — Telehealth: Payer: Self-pay | Admitting: Obstetrics and Gynecology

## 2017-07-23 ENCOUNTER — Other Ambulatory Visit: Payer: Self-pay

## 2017-07-23 NOTE — Telephone Encounter (Signed)
Patient needs refill on bc, she is completely out, annual scheduled 6/6 with [facility], please send in refill ASAP to [facility]

## 2017-07-23 NOTE — Telephone Encounter (Signed)
Rx refilled this morning 

## 2017-08-01 ENCOUNTER — Ambulatory Visit (INDEPENDENT_AMBULATORY_CARE_PROVIDER_SITE_OTHER): Payer: BLUE CROSS/BLUE SHIELD | Admitting: Obstetrics and Gynecology

## 2017-08-01 ENCOUNTER — Encounter: Payer: Self-pay | Admitting: Obstetrics and Gynecology

## 2017-08-01 VITALS — BP 124/78 | HR 76 | Ht 66.0 in | Wt 147.0 lb

## 2017-08-01 DIAGNOSIS — R8290 Unspecified abnormal findings in urine: Secondary | ICD-10-CM | POA: Diagnosis not present

## 2017-08-01 DIAGNOSIS — Z01419 Encounter for gynecological examination (general) (routine) without abnormal findings: Secondary | ICD-10-CM

## 2017-08-01 DIAGNOSIS — N921 Excessive and frequent menstruation with irregular cycle: Secondary | ICD-10-CM

## 2017-08-01 DIAGNOSIS — Z113 Encounter for screening for infections with a predominantly sexual mode of transmission: Secondary | ICD-10-CM | POA: Diagnosis not present

## 2017-08-01 DIAGNOSIS — Z3041 Encounter for surveillance of contraceptive pills: Secondary | ICD-10-CM

## 2017-08-01 DIAGNOSIS — N898 Other specified noninflammatory disorders of vagina: Secondary | ICD-10-CM

## 2017-08-01 LAB — POCT URINALYSIS DIPSTICK
Bilirubin, UA: NEGATIVE
Blood, UA: NEGATIVE
Glucose, UA: NEGATIVE
Ketones, UA: NEGATIVE
NITRITE UA: NEGATIVE
Protein, UA: NEGATIVE
Spec Grav, UA: 1.01 (ref 1.010–1.025)
pH, UA: 6 (ref 5.0–8.0)

## 2017-08-01 MED ORDER — DROSPIRENONE-ETHINYL ESTRADIOL 3-0.02 MG PO TABS: 1.0000 | ORAL_TABLET | Freq: Every day | ORAL | 3 refills | Status: DC

## 2017-08-01 NOTE — Progress Notes (Signed)
Chief Complaint  Patient presents with  . Gynecologic Exam    Urine smells funny, with thicker white d/c     HPI:      [name] is a 22 y.o. G0P0000 who LMP was Patient's last menstrual period was 07/07/2017 (approximate)., presents today for her annual examination.  Her menses are regular every 28-30 days, lasting 4-5 days, much shorter with Yaz (changed last yr).  Dysmenorrhea moderate, occurring first 1-2 days of flow. She does not have intermenstrual bleeding usually but did have it last month without late/missed OCPs. Bleeding started 3rd wk of pills and lasted 2 wks.  Sex activity: single partner, contraception - OCP (estrogen/progesterone).  Hx of STDs: none Has noticed increased d/c for "awhile" without itch/irritation, odor. Also has had urine odor intermittently, with occas frequency and urgency. No dysuria.   There is a FH of breast cancer in her mat grt aunt, genetic testing not indicated. There is no FH of ovarian cancer. The patient does do self-breast exams.  Tobacco use: The patient denies current or previous tobacco use. Alcohol use: none Exercise: moderately active  She does get adequate calcium and Vitamin D in her diet.  Gardasil completed.   Past Medical History:  Diagnosis Date  . Asthma   . Immunization, viral disease    GARDASIL SERIES COMPLETE    Past Surgical History:  Procedure Laterality Date  . WISDOM TOOTH EXTRACTION  03/29/2014    Family History  Problem Relation Age of Onset  . Diabetes Mother   . Hypertension Mother   . Diabetes Maternal Grandmother   . Hypertension Maternal Grandmother   . Diabetes Maternal Grandfather   . Hypertension Maternal Grandfather   . Other Maternal Aunt        cx dysplasia  . Breast cancer Other     Social History   Socioeconomic History  . Marital status: Single    Spouse name: Not on file  . Number of children: 0  . Years of education: 20  . Highest education level: Not on  file  Occupational History  . Not on file  Social Needs  . Financial resource strain: Not on file  . Food insecurity:    Worry: Not on file    Inability: Not on file  . Transportation needs:    Medical: Not on file    Non-medical: Not on file  Tobacco Use  . Smoking status: Never Smoker  . Smokeless tobacco: Never Used  Substance and Sexual Activity  . Alcohol use: No  . Drug use: No  . Sexual activity: Yes    Birth control/protection: Pill  Lifestyle  . Physical activity:    Days per week: Not on file    Minutes per session: Not on file  . Stress: Not on file  Relationships  . Social connections:    Talks on phone: Not on file    Gets together: Not on file    Attends religious service: Not on file    Active member of club or organization: Not on file    Attends meetings of clubs or organizations: Not on file    Relationship status: Not on file  . Intimate partner violence:    Fear of current or ex partner: Not on file    Emotionally abused: Not on file    Physically abused: Not on file    Forced sexual activity: Not on file  Other Topics Concern  . Not on file  Social  History Narrative  . Not on file    Current Outpatient Medications on File Prior to Visit  Medication Sig Dispense Refill  . amoxicillin-clavulanate (AUGMENTIN) 875-125 MG tablet Take 1 tablet by mouth every 12 (twelve) hours. (Patient not taking: Reported on 08/01/2017) 20 tablet 0  . ketorolac (TORADOL) 10 MG tablet Take 1 tablet (10 mg total) by mouth every 6 (six) hours as needed. (Patient not taking: Reported on 08/01/2017) 20 tablet 0   No current facility-administered medications on file prior to visit.      ROS:  Review of Systems  Constitutional: Negative for fatigue, fever and unexpected weight change.  Respiratory: Negative for cough, shortness of breath and wheezing.   Cardiovascular: Negative for chest pain, palpitations and leg swelling.  Gastrointestinal: Negative for blood in stool,  constipation, diarrhea, nausea and vomiting.  Endocrine: Negative for cold intolerance, heat intolerance and polyuria.  Genitourinary: Positive for vaginal discharge. Negative for dyspareunia, dysuria, flank pain, frequency, genital sores, hematuria, menstrual problem, pelvic pain, urgency, vaginal bleeding and vaginal pain.  Musculoskeletal: Negative for back pain, joint swelling and myalgias.  Skin: Negative for rash.  Neurological: Negative for dizziness, syncope, light-headedness, numbness and headaches.  Hematological: Negative for adenopathy.  Psychiatric/Behavioral: Negative for agitation, confusion, sleep disturbance and suicidal ideas. The patient is not nervous/anxious.      Objective: BP 124/78   Pulse 76   Ht 5\' 6"  (1.676 m)   Wt 147 lb (66.7 kg)   LMP 07/07/2017 (Approximate)   BMI 23.73 kg/m    Physical Exam  Constitutional: She is oriented to person, place, and time. She appears well-developed and well-nourished.  Genitourinary: Vagina normal and uterus normal. There is no rash or tenderness on the right labia. There is no rash or tenderness on the left labia. No erythema or tenderness in the vagina. No vaginal discharge found. Right adnexum does not display mass and does not display tenderness. Left adnexum does not display mass and does not display tenderness. Cervix does not exhibit motion tenderness or polyp. Uterus is not enlarged or tender.  Neck: Normal range of motion. No thyromegaly present.  Cardiovascular: Normal rate, regular rhythm and normal heart sounds.  No murmur heard. Pulmonary/Chest: Effort normal and breath sounds normal. Right breast exhibits no mass, no nipple discharge, no skin change and no tenderness. Left breast exhibits no mass, no nipple discharge, no skin change and no tenderness.  Abdominal: Soft. There is no tenderness. There is no guarding.  Musculoskeletal: Normal range of motion.  Neurological: She is alert and oriented to person, place,  and time. No cranial nerve deficit.  Psychiatric: She has a normal mood and affect. Her behavior is normal.  Vitals reviewed.   Results: Results for orders placed or performed in visit on 08/01/17 (from the past 24 hour(s))  POCT Urinalysis Dipstick     Status: Abnormal   Collection Time: 08/01/17  2:35 PM  Result Value Ref Range   Color, UA yellow    Clarity, UA clear    Glucose, UA Negative Negative   Bilirubin, UA neg    Ketones, UA neg    Spec Grav, UA 1.010 1.010 - 1.025   Blood, UA neg    pH, UA 6.0 5.0 - 8.0   Protein, UA Negative Negative   Urobilinogen, UA  0.2 or 1.0 E.U./dL   Nitrite, UA neg    Leukocytes, UA Trace (A) Negative   Appearance     Odor      Assessment/Plan: Encounter  for annual routine gynecological examination  Screening for STD (sexually transmitted disease) - Plan: Chlamydia/Gonococcus/Trichomonas, NAA  Encounter for surveillance of contraceptive pills - OCP RF - Plan: drospirenone-ethinyl estradiol (LORYNA) 3-0.02 MG tablet  Breakthrough bleeding on birth control pills - Last month on OCPs without late/missed pills. F/u if sx persist for pill change.   Vaginal discharge - Normal on exam. Check gon/chlam. If neg, reassurance. F/u prn.   Abnormal urine odor - Neg dip. Increase fluids/question related to certain foods. F/u prn.  - Plan: POCT Urinalysis Dipstick  Meds ordered this encounter  Medications  . drospirenone-ethinyl estradiol (LORYNA) 3-0.02 MG tablet    Sig: Take 1 tablet by mouth daily.    Dispense:  84 tablet    Refill:  3    Order Specific Question:   Supervising Provider    Answer:   Nadara Mustard [[id number]]             GYN counsel adequate intake of calcium and vitamin D, diet and exercise     F/U  Return in about 1 year (around 08/02/2018).  Odell Fasching B. Omarii Scalzo, PA-C 08/01/2017 2:38 PM

## 2017-08-01 NOTE — Patient Instructions (Signed)
I value your feedback and entrusting us with your care. If you get a [facility] patient survey, I would appreciate you taking the time to let us know about your experience today. Thank you! 

## 2017-08-04 LAB — CHLAMYDIA/GONOCOCCUS/TRICHOMONAS, NAA
Chlamydia by NAA: NEGATIVE
Gonococcus by NAA: NEGATIVE
TRICH VAG BY NAA: NEGATIVE

## 2017-09-26 ENCOUNTER — Encounter: Payer: Self-pay | Admitting: Gastroenterology

## 2017-09-26 ENCOUNTER — Other Ambulatory Visit
Admission: RE | Admit: 2017-09-26 | Discharge: 2017-09-26 | Disposition: A | Discharge status: Home / Self Care | Payer: BLUE CROSS/BLUE SHIELD | Source: Ambulatory Visit | Attending: Gastroenterology | Admitting: Gastroenterology

## 2017-09-26 ENCOUNTER — Ambulatory Visit (INDEPENDENT_AMBULATORY_CARE_PROVIDER_SITE_OTHER): Payer: BLUE CROSS/BLUE SHIELD | Admitting: Gastroenterology

## 2017-09-26 VITALS — BP 125/88 | HR 76 | Ht 66.0 in | Wt 145.6 lb

## 2017-09-26 DIAGNOSIS — R197 Diarrhea, unspecified: Secondary | ICD-10-CM | POA: Insufficient documentation

## 2017-09-26 LAB — COMPREHENSIVE METABOLIC PANEL
ALK PHOS: 48 U/L (ref 38–126)
ALT: 15 U/L (ref 0–44)
AST: 16 U/L (ref 15–41)
Albumin: 4.5 g/dL (ref 3.5–5.0)
Anion gap: 9 (ref 5–15)
BUN: 13 mg/dL (ref 6–20)
CO2: 26 mmol/L (ref 22–32)
CREATININE: 0.85 mg/dL (ref 0.44–1.00)
Calcium: 9.6 mg/dL (ref 8.9–10.3)
Chloride: 105 mmol/L (ref 98–111)
Glucose, Bld: 89 mg/dL (ref 70–99)
Potassium: 3.6 mmol/L (ref 3.5–5.1)
Sodium: 140 mmol/L (ref 135–145)
Total Bilirubin: 0.7 mg/dL (ref 0.3–1.2)
Total Protein: 8.2 g/dL — ABNORMAL HIGH (ref 6.5–8.1)

## 2017-09-26 LAB — CBC
HCT: 41.3 % (ref 35.0–47.0)
Hemoglobin: 14.1 g/dL (ref 12.0–16.0)
MCH: 30.5 pg (ref 26.0–34.0)
MCHC: 34.2 g/dL (ref 32.0–36.0)
MCV: 89.2 fL (ref 80.0–100.0)
PLATELETS: 327 10*3/uL (ref 150–440)
RBC: 4.63 MIL/uL (ref 3.80–5.20)
RDW: 13.5 % (ref 11.5–14.5)
WBC: 11.4 10*3/uL — ABNORMAL HIGH (ref 3.6–11.0)

## 2017-09-26 LAB — C-REACTIVE PROTEIN

## 2017-09-26 NOTE — Progress Notes (Signed)
Jodi Chapman [address]  [address]  [place], [place] [address]  Main: [phone]  Fax: [phone]   Gastroenterology Consultation  Referring Provider:     Martie Round, NP Primary Care Physician:  Jodi Round, NP Primary Gastroenterologist:  Dr. Melodie Chapman Reason for Consultation:     Diarrhea        HPI:    Chief Complaint  Patient presents with  . New Patient (Initial Visit)    Frequent loose stools, upset stomach, pelvic discomfort onset sudden and unpredictable pattern    Jodi Chapman is a 22 y.o. y/o female referred for consultation & management  by Dr. Martie Round, NP.  Patient reports 1 year history of loose stools, 4-6 bowel movements a day that are watery to loose.  Does not wake her up at night.  No changes in diet over the last year.  No changes in medications.  No weight loss.  No heartburn, or dysphagia.  Reports occasional bright red blood per rectum usually on the toilet paper only, occurs when she has had multiple bowel movements a day.  Normal CBC and hemoglobin in July 2019 despite a year of symptoms.  No family history of colon cancer or IBD.  No prior colonoscopy or EGD.  Occasional ibuprofen use during her menstrual cycle.  Past Medical History:  Diagnosis Date  . Asthma   . Immunization, viral disease    GARDASIL SERIES COMPLETE    Past Surgical History:  Procedure Laterality Date  . WISDOM TOOTH EXTRACTION  03/29/2014    Prior to Admission medications   Medication Sig Start Date End Date Taking? Authorizing Provider  drospirenone-ethinyl estradiol (LORYNA) 3-0.02 MG tablet Take 1 tablet by mouth daily. 08/01/17  Yes Copland, Alicia B, PA-C  amoxicillin-clavulanate (AUGMENTIN) 875-125 MG tablet Take 1 tablet by mouth every 12 (twelve) hours. Patient not taking: Reported on 08/01/2017 01/25/17   Lutricia Feil, PA-C  ketorolac (TORADOL) 10 MG tablet Take 1 tablet (10 mg total) by mouth every 6 (six) hours as  needed. Patient not taking: Reported on 08/01/2017 04/09/17   Darci Current, MD    Family History  Problem Relation Age of Onset  . Diabetes Mother   . Hypertension Mother   . Diabetes Maternal Grandmother   . Hypertension Maternal Grandmother   . Diabetes Maternal Grandfather   . Hypertension Maternal Grandfather   . Other Maternal Aunt        cx dysplasia  . Breast cancer Other      Social History   Tobacco Use  . Smoking status: Never Smoker  . Smokeless tobacco: Never Used  Substance Use Topics  . Alcohol use: No  . Drug use: No    Allergies as of 09/26/2017  . (No Known Allergies)    Review of Systems:    All systems reviewed and negative except where noted in HPI.   Physical Exam:  BP 125/88   Pulse 76   Ht 5\' 6"  (1.676 m)   Wt 145 lb 9.6 oz (66 kg)   BMI 23.50 kg/m  No LMP recorded. (Menstrual status: Oral contraceptives). Psych:  Alert and cooperative. Normal mood and affect. General:   Alert,  Well-developed, well-nourished, pleasant and cooperative in NAD Head:  Normocephalic and atraumatic. Eyes:  Sclera clear, no icterus.   Conjunctiva pink. Ears:  Normal auditory acuity. Nose:  No deformity, discharge, or lesions. Mouth:  No deformity or lesions,oropharynx pink & moist. Neck:  Supple; no masses  or thyromegaly. Lungs:  Respirations even and unlabored.  Clear throughout to auscultation.   No wheezes, crackles, or rhonchi. No acute distress. Heart:  Regular rate and rhythm; no murmurs, clicks, rubs, or gallops. Abdomen:  Normal bowel sounds.  No bruits.  Soft, non-tender and non-distended without masses, hepatosplenomegaly or hernias noted.  No guarding or rebound tenderness.    Msk:  Symmetrical without gross deformities. Good, equal movement & strength bilaterally. Pulses:  Normal pulses noted. Extremities:  No clubbing or edema.  No cyanosis. Neurologic:  Alert and oriented x3;  grossly normal neurologically. Skin:  Intact without significant  lesions or rashes. No jaundice. Lymph Nodes:  No significant cervical adenopathy. Psych:  Alert and cooperative. Normal mood and affect.   Labs: Normal CBC February 2019  Imaging Studies: No results found.  Assessment and Plan:   Jodi Chapman is a 22 y.o. y/o female has been referred for 1 year history of daily loose stools  Symptoms are most consistent with IBS, but she is a right age for presenting with IBD as well We will initiate work-up with stool testing, fecal calprotectin, infectious work-up and obtain blood work as well Depending on testing, can change management further Patient asked to avoid sugars, and eat a healthy regular diet Use Metamucil daily to help bulk stool as well We will follow-up in clinic to assess symptoms   Dr Jodi Chapman

## 2017-09-26 NOTE — Patient Instructions (Signed)
F/u 3 months 

## 2017-09-30 ENCOUNTER — Other Ambulatory Visit
Admission: RE | Admit: 2017-09-30 | Discharge: 2017-09-30 | Disposition: A | Discharge status: Home / Self Care | Payer: BLUE CROSS/BLUE SHIELD | Source: Ambulatory Visit | Attending: Gastroenterology | Admitting: Gastroenterology

## 2017-09-30 DIAGNOSIS — R197 Diarrhea, unspecified: Secondary | ICD-10-CM | POA: Diagnosis present

## 2017-09-30 LAB — GASTROINTESTINAL PANEL BY PCR, STOOL (REPLACES STOOL CULTURE)
Adenovirus F40/41: NOT DETECTED
Astrovirus: NOT DETECTED
Campylobacter species: NOT DETECTED
Cryptosporidium: NOT DETECTED
Cyclospora cayetanensis: NOT DETECTED
ENTEROAGGREGATIVE E COLI (EAEC): NOT DETECTED
ENTEROTOXIGENIC E COLI (ETEC): NOT DETECTED
Entamoeba histolytica: NOT DETECTED
Enteropathogenic E coli (EPEC): NOT DETECTED
GIARDIA LAMBLIA: NOT DETECTED
NOROVIRUS GI/GII: NOT DETECTED
Plesimonas shigelloides: NOT DETECTED
ROTAVIRUS A: NOT DETECTED
Salmonella species: NOT DETECTED
Sapovirus (I, II, IV, and V): NOT DETECTED
Shiga like toxin producing E coli (STEC): NOT DETECTED
Shigella/Enteroinvasive E coli (EIEC): NOT DETECTED
Vibrio cholerae: NOT DETECTED
Vibrio species: NOT DETECTED
Yersinia enterocolitica: NOT DETECTED

## 2017-09-30 LAB — C DIFFICILE QUICK SCREEN W PCR REFLEX
C DIFFICILE (CDIFF) TOXIN: NEGATIVE
C Diff antigen: NEGATIVE
C Diff interpretation: NOT DETECTED

## 2017-10-01 LAB — CALPROTECTIN, FECAL: Calprotectin, Fecal: <16 ug/g (ref 0–120)

## 2017-10-03 LAB — H. PYLORI ANTIGEN, STOOL: H. Pylori Stool Ag, Eia: NEGATIVE

## 2017-10-04 LAB — PANCREATIC ELASTASE, FECAL

## 2017-10-15 ENCOUNTER — Emergency Department: Payer: BLUE CROSS/BLUE SHIELD

## 2017-10-15 ENCOUNTER — Emergency Department
Admission: EM | Admit: 2017-10-15 | Discharge: 2017-10-15 | Disposition: A | Discharge status: Home / Self Care | Payer: BLUE CROSS/BLUE SHIELD | Attending: Emergency Medicine | Admitting: Emergency Medicine

## 2017-10-15 ENCOUNTER — Encounter: Payer: Self-pay | Admitting: *Deleted

## 2017-10-15 ENCOUNTER — Other Ambulatory Visit: Payer: Self-pay

## 2017-10-15 DIAGNOSIS — R233 Spontaneous ecchymoses: Secondary | ICD-10-CM | POA: Insufficient documentation

## 2017-10-15 DIAGNOSIS — R58 Hemorrhage, not elsewhere classified: Secondary | ICD-10-CM

## 2017-10-15 DIAGNOSIS — Z79899 Other long term (current) drug therapy: Secondary | ICD-10-CM | POA: Insufficient documentation

## 2017-10-15 DIAGNOSIS — M7989 Other specified soft tissue disorders: Secondary | ICD-10-CM | POA: Insufficient documentation

## 2017-10-15 DIAGNOSIS — J45909 Unspecified asthma, uncomplicated: Secondary | ICD-10-CM | POA: Insufficient documentation

## 2017-10-15 LAB — PROTIME-INR
INR: 0.95
PROTHROMBIN TIME: 12.6 s (ref 11.4–15.2)

## 2017-10-15 LAB — CBC WITH DIFFERENTIAL/PLATELET
BASOS ABS: 0.1 10*3/uL (ref 0–0.1)
BASOS PCT: 1 %
EOS PCT: 2 %
Eosinophils Absolute: 0.2 10*3/uL (ref 0–0.7)
HCT: 41.6 % (ref 35.0–47.0)
HEMOGLOBIN: 14.3 g/dL (ref 12.0–16.0)
LYMPHS ABS: 4.4 10*3/uL — AB (ref 1.0–3.6)
Lymphocytes Relative: 37 %
MCH: 31 pg (ref 26.0–34.0)
MCHC: 34.4 g/dL (ref 32.0–36.0)
MCV: 90 fL (ref 80.0–100.0)
Monocytes Absolute: 0.8 10*3/uL (ref 0.2–0.9)
Monocytes Relative: 7 %
NEUTROS PCT: 53 %
Neutro Abs: 6.5 10*3/uL (ref 1.4–6.5)
Platelets: 342 10*3/uL (ref 150–440)
RBC: 4.62 MIL/uL (ref 3.80–5.20)
RDW: 13.4 % (ref 11.5–14.5)
WBC: 11.9 10*3/uL — AB (ref 3.6–11.0)

## 2017-10-15 LAB — APTT: APTT: 27 s (ref 24–36)

## 2017-10-15 LAB — COMPREHENSIVE METABOLIC PANEL
ALT: 17 U/L (ref 0–44)
AST: 16 U/L (ref 15–41)
Albumin: 4.5 g/dL (ref 3.5–5.0)
Alkaline Phosphatase: 45 U/L (ref 38–126)
Anion gap: 5 (ref 5–15)
BILIRUBIN TOTAL: 0.3 mg/dL (ref 0.3–1.2)
BUN: 13 mg/dL (ref 6–20)
CHLORIDE: 106 mmol/L (ref 98–111)
CO2: 28 mmol/L (ref 22–32)
Calcium: 9.5 mg/dL (ref 8.9–10.3)
Creatinine, Ser: 0.78 mg/dL (ref 0.44–1.00)
Glucose, Bld: 99 mg/dL (ref 70–99)
Potassium: 3.8 mmol/L (ref 3.5–5.1)
Sodium: 139 mmol/L (ref 135–145)
TOTAL PROTEIN: 8 g/dL (ref 6.5–8.1)

## 2017-10-15 MED ORDER — MELOXICAM 15 MG PO TABS: 15.0000 mg | ORAL_TABLET | Freq: Every day | ORAL | 0 refills | Status: DC

## 2017-10-15 NOTE — ED Triage Notes (Signed)
Pt to ED reporting swelling and bruising noted to the right lower arm. Pt donated plasma Wednesday and since then the bruising has worsened . PT also reports pain when straightening arm and pain when attempting to lift things at work.

## 2017-10-15 NOTE — ED Provider Notes (Signed)
[facility] Emergency Department Provider Note  ____________________________________________  Time seen: Approximately 6:02 PM  I have reviewed the triage vital signs and the nursing notes.   HISTORY  Chief Complaint Bleeding/Bruising    HPI Jodi Chapman is a 22 y.o. female who presents the emergency department for significant bruising to the right arm.  Patient reports that she donated plasma approximately a week ago, this went well without complications.  A day or 2 later she noticed some bruising to the arm which she attributed to giving blood, marked it with ink to visualize margins.  Patient reports that the bruising has continued to increase, is becoming painful.  Patient has bruising both above and below IV site from plasma donation.  She has no history of bleeding or clotting disorders.  Patient has not been taking any medication for this.  She is not on blood thinners.  Patient denies any numbness or tingling in her arm.  No headache, visual changes, neck pain, chest pain, shortness of breath, abdominal pain, nausea vomiting.  No other unexplained ecchymosis.    Past Medical History:  Diagnosis Date  . Asthma   . Immunization, viral disease    GARDASIL SERIES COMPLETE    There are no active problems to display for this patient.   Past Surgical History:  Procedure Laterality Date  . WISDOM TOOTH EXTRACTION  03/29/2014    Prior to Admission medications   Medication Sig Start Date End Date Taking? Authorizing Provider  amoxicillin-clavulanate (AUGMENTIN) 875-125 MG tablet Take 1 tablet by mouth every 12 (twelve) hours. Patient not taking: Reported on 08/01/2017 01/25/17   Lutricia Feiloemer, William P, PA-C  drospirenone-ethinyl estradiol (LORYNA) 3-0.02 MG tablet Take 1 tablet by mouth daily. 08/01/17   Copland, Ilona SorrelAlicia B, PA-C  ketorolac (TORADOL) 10 MG tablet Take 1 tablet (10 mg total) by mouth every 6 (six) hours as needed. Patient not taking: Reported  on 08/01/2017 04/09/17   Darci CurrentBrown, [place] N, MD  meloxicam (MOBIC) 15 MG tablet Take 1 tablet (15 mg total) by mouth daily. 10/15/17   Michaelene Dutan, Delorise RoyalsJonathan D, PA-C    Allergies Patient has no known allergies.  Family History  Problem Relation Age of Onset  . Diabetes Mother   . Hypertension Mother   . Diabetes Maternal Grandmother   . Hypertension Maternal Grandmother   . Diabetes Maternal Grandfather   . Hypertension Maternal Grandfather   . Other Maternal Aunt        cx dysplasia  . Breast cancer Other     Social History Social History   Tobacco Use  . Smoking status: Never Smoker  . Smokeless tobacco: Never Used  Substance Use Topics  . Alcohol use: No  . Drug use: No     Review of Systems  Constitutional: No fever/chills Eyes: No visual changes. No discharge ENT: No upper respiratory complaints. Cardiovascular: no chest pain. Respiratory: no cough. No SOB. Gastrointestinal: No abdominal pain.  No nausea, no vomiting.  No diarrhea.  No constipation. Genitourinary: Negative for dysuria. No hematuria Musculoskeletal: Negative for musculoskeletal pain. Skin: Negative for rash, abrasions, lacerations.  Patient with significant, worsening ecchymosis to the right arm. Neurological: Negative for headaches, focal weakness or numbness. 10-point ROS otherwise negative.  ____________________________________________   PHYSICAL EXAM:  VITAL SIGNS: ED Triage Vitals  Enc Vitals Group     BP --      Pulse --      Resp --      Temp --  Temp src --      SpO2 --      Weight 10/15/17 1712 145 lb 8.1 oz (66 kg)     Height 10/15/17 1712 5\' 6"  (1.676 m)     Head Circumference --      Peak Flow --      Pain Score 10/15/17 1711 6     Pain Loc --      Pain Edu? --      Excl. in GC? --      Constitutional: Alert and oriented. Well appearing and in no acute distress. Eyes: Conjunctivae are normal. PERRL. EOMI. Head: Atraumatic. ENT:      Ears:       Nose: No  congestion/rhinnorhea.      Mouth/Throat: Mucous membranes are moist.  Neck: No stridor.   Hematological/Lymphatic/Immunilogical: No cervical lymphadenopathy. Cardiovascular: Normal rate, regular rhythm. Normal S1 and S2.  Good peripheral circulation. Respiratory: Normal respiratory effort without tachypnea or retractions. Lungs CTAB. Good air entry to the bases with no decreased or absent breath sounds. Musculoskeletal: Full range of motion to all extremities. No gross deformities appreciated. Neurologic:  Normal speech and language. No gross focal neurologic deficits are appreciated.  Skin:  Skin is warm, dry and intact. No rash noted.  Visualization of the right arm reveals significant ecchymosis on the anterior arm, minor ecchymosis to the dorsal arm.  This extends from distal humerus down to the wrist.  Ecchymosis is tender to palpation.  Patient has healing lesion to the right AC region consistent with previous blood draw.  No erythema or edema consistent with cellulitis.  No petechial lesions noted to the right upper extremity.  No other areas of ecchymosis are noted over the body. Psychiatric: Mood and affect are normal. Speech and behavior are normal. Patient exhibits appropriate insight and judgement.   ____________________________________________   LABS (all labs ordered are listed, but only abnormal results are displayed)  Labs Reviewed  CBC WITH DIFFERENTIAL/PLATELET - Abnormal; Notable for the following components:      Result Value   WBC 11.9 (*)    Lymphs Abs 4.4 (*)    All other components within normal limits  COMPREHENSIVE METABOLIC PANEL  PROTIME-INR  APTT   ____________________________________________  EKG   ____________________________________________  RADIOLOGY I personally viewed and evaluated these images as part of my medical decision making, as well as reviewing the written report by the radiologist.  US Venous Img Upper Uni Right  Result Date:  10/15/2017 CLINICAL DATA:  RIGHT arm swelling and ecchymosis for 5 days, donated plasma on 10/09/2017 and RIGHT arm has been swollen since EXAM: RIGHT UPPER EXTREMITY VENOUS DOPPLER ULTRASOUND TECHNIQUE: Gray-scale sonography with graded compression, as well as color Doppler and duplex ultrasound were performed to evaluate the upper extremity deep venous system from the level of the subclavian vein and including the jugular, axillary, basilic, radial, ulnar and upper cephalic vein. Spectral Doppler was utilized to evaluate flow at rest and with distal augmentation maneuvers. COMPARISON:  None FINDINGS: Contralateral Subclavian Vein: Respiratory phasicity is normal and symmetric with the symptomatic side. No evidence of thrombus. Normal compressibility. Internal Jugular Vein: No evidence of thrombus. Normal compressibility, respiratory phasicity and response to augmentation. Subclavian Vein: No evidence of thrombus. Normal compressibility, respiratory phasicity and response to augmentation. Axillary Vein: No evidence of thrombus. Normal compressibility, respiratory phasicity and response to augmentation. Cephalic Vein: No evidence of thrombus. Normal compressibility, respiratory phasicity and response to augmentation. Basilic Vein: No evidence of thrombus. Normal  compressibility, respiratory phasicity and response to augmentation. Brachial Veins: No evidence of thrombus. Normal compressibility, respiratory phasicity and response to augmentation. Radial Veins: No evidence of thrombus. Normal compressibility, respiratory phasicity and response to augmentation. Ulnar Veins: No evidence of thrombus. Normal compressibility, respiratory phasicity and response to augmentation. Venous Reflux:  None visualized. Other Findings:  None visualized. IMPRESSION: No evidence of DVT within the RIGHT upper extremity. Electronically Signed   By: Ulyses SouthwardMark  Boles M.D.   On: 10/15/2017 19:10     ____________________________________________    PROCEDURES  Procedure(s) performed:    Procedures    Medications - No data to display   ____________________________________________   INITIAL IMPRESSION / ASSESSMENT AND PLAN / ED COURSE  Pertinent labs & imaging results that were available during my care of the patient were reviewed by me and considered in my medical decision making (see chart for details).  Review of the [place] CSRS was performed in accordance of the [facility] prior to dispensing any controlled drugs.      Patient's diagnosis is consistent with ecchymosis.  Patient presents the emergency department with significant ecchymosis to the right arm after donating plasma.  Exam was overall reassuring with no indication of infection/phlebitis.  Area was very tender to palpation.  Given nature of extensive bruising, differential included DVT, superficial thrombosis, thrombocytopenia, ecchymosis secondary to blood draw/minor venous injury.  Labs returned with reassuring results with no indication of thrombocytopenia.  Ultrasound returns with reassuring results with no indication of DVT.  At this time, patient is to be prescribed anti-inflammatories, use warm compresses, avoid further injury to extremity.  Follow-up with primary care as needed. Patient is given ED precautions to return to the ED for any worsening or new symptoms.     ____________________________________________  FINAL CLINICAL IMPRESSION(S) / ED DIAGNOSES  Final diagnoses:  Ecchymosis      NEW MEDICATIONS STARTED DURING THIS VISIT:  ED Discharge Orders         Ordered    meloxicam (MOBIC) 15 MG tablet  Daily     10/15/17 2014              This chart was dictated using voice recognition software/Dragon. Despite best efforts to proofread, errors can occur which can change the meaning. Any change was purely unintentional.    Racheal PatchesCuthriell, Chevelle Durr D, PA-C 10/15/17 2015    Phineas SemenGoodman, [place],  MD 10/15/17 [phone]

## 2017-10-15 NOTE — ED Notes (Signed)
PT GONE TO [place]

## 2017-12-31 ENCOUNTER — Ambulatory Visit: Payer: BLUE CROSS/BLUE SHIELD | Admitting: Gastroenterology

## 2018-02-24 ENCOUNTER — Other Ambulatory Visit: Payer: Self-pay | Admitting: Obstetrics and Gynecology

## 2018-02-24 DIAGNOSIS — Z3041 Encounter for surveillance of contraceptive pills: Secondary | ICD-10-CM

## 2019-03-24 ENCOUNTER — Other Ambulatory Visit: Payer: Self-pay | Admitting: Obstetrics and Gynecology

## 2019-03-24 DIAGNOSIS — Z3041 Encounter for surveillance of contraceptive pills: Secondary | ICD-10-CM

## 2019-07-02 ENCOUNTER — Other Ambulatory Visit: Payer: Self-pay

## 2019-07-02 ENCOUNTER — Other Ambulatory Visit (HOSPITAL_COMMUNITY)
Admission: RE | Admit: 2019-07-02 | Discharge: 2019-07-02 | Disposition: A | Discharge status: Home / Self Care | Payer: Medicaid Other | Source: Ambulatory Visit | Attending: Obstetrics and Gynecology | Admitting: Obstetrics and Gynecology

## 2019-07-02 ENCOUNTER — Encounter: Payer: Self-pay | Admitting: Obstetrics and Gynecology

## 2019-07-02 ENCOUNTER — Ambulatory Visit (INDEPENDENT_AMBULATORY_CARE_PROVIDER_SITE_OTHER): Payer: Medicaid Other | Admitting: Obstetrics and Gynecology

## 2019-07-02 VITALS — BP 120/60 | Ht 66.0 in | Wt 154.0 lb

## 2019-07-02 DIAGNOSIS — Z01419 Encounter for gynecological examination (general) (routine) without abnormal findings: Secondary | ICD-10-CM | POA: Diagnosis present

## 2019-07-02 DIAGNOSIS — Z113 Encounter for screening for infections with a predominantly sexual mode of transmission: Secondary | ICD-10-CM

## 2019-07-02 DIAGNOSIS — Z124 Encounter for screening for malignant neoplasm of cervix: Secondary | ICD-10-CM

## 2019-07-02 DIAGNOSIS — Z3041 Encounter for surveillance of contraceptive pills: Secondary | ICD-10-CM

## 2019-07-02 MED ORDER — MICROGESTIN 24 FE 1-20 MG-MCG PO TABS: 1.0000 | ORAL_TABLET | Freq: Every day | ORAL | 3 refills | Status: DC

## 2019-07-02 NOTE — Progress Notes (Signed)
Chief Complaint  Patient presents with  . Gynecologic Exam  . Contraception    OCP's     HPI:      Jodi Chapman is a 24 y.o. G0P0000 who LMP was Patient's last menstrual period was 06/01/2019 (exact date)., presents today for her annual examination.  Her menses are regular every 28-30 days, lasting 4-6 days off Yaz, minimal dysmen. Stopped OCPs a few months ago. Menses on yaz were 7+ days with worse dysmen. Would like to go back on OCPs but try a different one. Helps with acne and other period sx too.   Sex activity: single partner, contraception--vasectomy.  Hx of STDs: none  Has had mild vaginal irritation ext recently without increased d/c and odor. Is using scented soap and dryer sheets.  There is a FH of breast cancer in her mat grt aunt, genetic testing not indicated. There is no FH of ovarian cancer. The patient does do self-breast exams.  Tobacco use: The patient denies current or previous tobacco use. Alcohol use: social No drug use Exercise: very active  She does get adequate calcium and Vitamin D in her diet.  Gardasil completed.   Past Medical History:  Diagnosis Date  . Asthma   . Immunization, viral disease    GARDASIL SERIES COMPLETE    Past Surgical History:  Procedure Laterality Date  . WISDOM TOOTH EXTRACTION  03/29/2014    Family History  Problem Relation Age of Onset  . Diabetes Mother   . Hypertension Mother   . Diabetes Maternal Grandmother   . Hypertension Maternal Grandmother   . Diabetes Maternal Grandfather   . Hypertension Maternal Grandfather   . Other Maternal Aunt        cx dysplasia  . Breast cancer Other        not sure of age, has contact    Social History   Socioeconomic History  . Marital status: Single    Spouse name: Not on file  . Number of children: 0  . Years of education: 55  . Highest education level: Not on file  Occupational History  . Not on file  Tobacco Use  . Smoking status: Never  Smoker  . Smokeless tobacco: Never Used  Substance and Sexual Activity  . Alcohol use: No  . Drug use: No  . Sexual activity: Yes    Birth control/protection: None  Other Topics Concern  . Not on file  Social History Narrative  . Not on file   Social Determinants of Health   Financial Resource Strain:   . Difficulty of Paying Living Expenses:   Food Insecurity:   . Worried About Programme researcher, broadcasting/film/video in the Last Year:   . Barista in the Last Year:   Transportation Needs:   . Freight forwarder (Medical):   Marland Kitchen Lack of Transportation (Non-Medical):   Physical Activity:   . Days of Exercise per Week:   . Minutes of Exercise per Session:   Stress:   . Feeling of Stress :   Social Connections:   . Frequency of Communication with Friends and Family:   . Frequency of Social Gatherings with Friends and Family:   . Attends Religious Services:   . Active Member of Clubs or Organizations:   . Attends Banker Meetings:   Marland Kitchen Marital Status:   Intimate Partner Violence:   . Fear of Current or Ex-Partner:   . Emotionally Abused:   Marland Kitchen Physically Abused:   .  Sexually Abused:     Current Outpatient Medications on File Prior to Visit  Medication Sig Dispense Refill  . drospirenone-ethinyl estradiol (YAZ,GIANVI,LORYNA) 3-0.02 MG tablet TAKE 1 TABLET BY MOUTH DAILY (Patient not taking: Reported on 07/02/2019) 84 tablet 1   No current facility-administered medications on file prior to visit.     ROS:  Review of Systems  Constitutional: Negative for fatigue, fever and unexpected weight change.  Respiratory: Negative for cough, shortness of breath and wheezing.   Cardiovascular: Negative for chest pain, palpitations and leg swelling.  Gastrointestinal: Negative for blood in stool, constipation, diarrhea, nausea and vomiting.  Endocrine: Negative for cold intolerance, heat intolerance and polyuria.  Genitourinary: Negative for dyspareunia, dysuria, flank pain,  frequency, genital sores, hematuria, menstrual problem, pelvic pain, urgency, vaginal bleeding, vaginal discharge and vaginal pain.  Musculoskeletal: Negative for back pain, joint swelling and myalgias.  Skin: Negative for rash.  Neurological: Negative for dizziness, syncope, light-headedness, numbness and headaches.  Hematological: Negative for adenopathy.  Psychiatric/Behavioral: Negative for agitation, confusion, sleep disturbance and suicidal ideas. The patient is not nervous/anxious.      Objective: BP 120/60   Ht 5\' 6"  (1.676 m)   Wt 154 lb (69.9 kg)   LMP 06/01/2019 (Exact Date)   BMI 24.86 kg/m    Physical Exam Constitutional:      Appearance: She is well-developed.  Genitourinary:     Vulva, vagina, uterus, right adnexa and left adnexa normal.     No vulval lesion or tenderness noted.     No vaginal discharge, erythema or tenderness.     No cervical motion tenderness or polyp.     Uterus is not enlarged or tender.     No right or left adnexal mass present.     Right adnexa not tender.     Left adnexa not tender.  Neck:     Thyroid: No thyromegaly.  Cardiovascular:     Rate and Rhythm: Normal rate and regular rhythm.     Heart sounds: Normal heart sounds. No murmur.  Pulmonary:     Effort: Pulmonary effort is normal.     Breath sounds: Normal breath sounds.  Chest:     Breasts:        Right: No mass, nipple discharge, skin change or tenderness.        Left: No mass, nipple discharge, skin change or tenderness.  Abdominal:     Palpations: Abdomen is soft.     Tenderness: There is no abdominal tenderness. There is no guarding.  Musculoskeletal:        General: Normal range of motion.     Cervical back: Normal range of motion.  Neurological:     General: No focal deficit present.     Mental Status: She is alert and oriented to person, place, and time.     Cranial Nerves: No cranial nerve deficit.  Skin:    General: Skin is warm and dry.  Psychiatric:         Mood and Affect: Mood normal.        Behavior: Behavior normal.        Thought Content: Thought content normal.        Judgment: Judgment normal.  Vitals reviewed.     Assessment/Plan: Encounter for annual routine gynecological examination  Cervical cancer screening - Plan: Cytology - PAP  Screening for STD (sexually transmitted disease) - Plan: Cytology - PAP  Encounter for surveillance of contraceptive pills - Plan: Norethindrone Acetate-Ethinyl Estrad-FE (MICROGESTIN  24 FE) 1-20 MG-MCG(24) tablet; OCP change from yaz. F/u prn. Rx eRxd.   Vaginal itching--neg exam. Dove sens skin soap, line dry underwear. F/u prn.   Meds ordered this encounter  Medications  . Norethindrone Acetate-Ethinyl Estrad-FE (MICROGESTIN 24 FE) 1-20 MG-MCG(24) tablet    Sig: Take 1 tablet by mouth daily.    Dispense:  84 tablet    Refill:  3    Order Specific Question:   Supervising Provider    Answer:   Gae Dry [[id number]]             GYN counsel adequate intake of calcium and vitamin D, diet and exercise     F/U  Return in about 1 year (around 07/01/2020).  Alicia B. Copland, PA-C 07/02/2019 2:30 PM

## 2019-07-02 NOTE — Patient Instructions (Signed)
I value your feedback and entrusting us with your care. If you get a [facility] patient survey, I would appreciate you taking the time to let us know about your experience today. Thank you!  As of February 05, 2019, your lab results will be released to your MyChart immediately, before I even have a chance to see them. Please give me time to review them and contact you if there are any abnormalities. Thank you for your patience.  

## 2019-07-06 LAB — CYTOLOGY - PAP
Chlamydia: NEGATIVE
Comment: NEGATIVE
Comment: NORMAL
Diagnosis: NEGATIVE
Neisseria Gonorrhea: NEGATIVE

## 2019-12-15 ENCOUNTER — Ambulatory Visit: Payer: Medicaid Other | Admitting: Obstetrics and Gynecology

## 2020-02-27 NOTE — L&D Delivery Note (Signed)
Delivery Summary for [name]  Labor Events:   Preterm labor: No data found  Rupture date: 02/10/2021  Rupture time: 7:30 AM  Rupture type: Spontaneous Artificial  Fluid Color: Clear  Induction: No data found  Augmentation: No data found  Complications: No data found  Cervical ripening: No data found No data found   No data found     Delivery:   Episiotomy: No data found  Lacerations: No data found  Repair suture: No data found  Repair # of packets: No data found  Blood loss (ml): 800   Information for the patient's newborn:  Yamil, Dougher [[id number]]   Delivery 02/10/2021 1:05 PM by  Vaginal, Spontaneous Sex:  female Gestational Age: [redacted]w[redacted]d Delivery Clinician:   Living?:         APGARS  One minute Five minutes Ten minutes  Skin color:        Heart rate:        Grimace:        Muscle tone:        Breathing:        Totals: 8  9      Presentation/position:      Resuscitation:   Cord information:    Disposition of cord blood:     Blood gases sent?  Complications:   Placenta: Delivered:       appearance Newborn Measurements: Weight: 5 lb 2.2 oz (2330 g)  Height: 18.9"  Head circumference:    Chest circumference:    Other providers:    Additional  information: Forceps:   Vacuum:   Breech:   Observed anomalies      Information for the patient's newborn:  Mariaha, Ellington [[id number]]   Delivery 02/10/2021 4:14 PM by  Vaginal, Spontaneous Sex:  female Gestational Age: [redacted]w[redacted]d Delivery Clinician:   Living?:         APGARS  One minute Five minutes Ten minutes  Skin color:        Heart rate:        Grimace:        Muscle tone:        Breathing:        Totals: 8  9      Presentation/position:      Resuscitation:   Cord information:    Disposition of cord blood:     Blood gases sent?  Complications:   Placenta: Delivered:       appearance Newborn Measurements: Weight: 5 lb 0.8 oz (2290 g)  Height: 18.5"  Head circumference:     Chest circumference:    Other providers:    Additional  information: Forceps:   Vacuum:   Breech:   Observed anomalies       Delivery Note   Ardell, Aaronson [[id number]]  At 1:05 PM a viable and healthy female "Ashteynn" was delivered via Vaginal, Spontaneous (Presentation: Vertex; ROA position ).  APGAR: 8, 9; weight 5 lb 2.2 oz (2330 g).  Delayed cord clamping was observed.  Placenta status: spontaneously removed after 2nd twin,.  Cord: 3-vessel with the following complications: none.      Taiwan, Millon Muddy [[id number]]  At 4:14 PM a viable and healthy female "Maevynn"was delivered via Vaginal, Spontaneous (Presentation: Vertex;  ).  APGAR: 8, 9; weight  2290 grams.  Delayed cord clamping was observed Placenta status: spontaneously removed.  Cord: 3-vessel with the following complications: None.  Baby B cord clamped for identification.    Anesthesia:  Epidural Episiotomy:  None Lacerations: None Suture Repair:  None Est. Blood Loss (mL):  800   Mom to postpartum.   Baby A to Couplet care / Skin to Skin.   Baby B to Couplet care / Skin to Skin.  Hildred Laser, MD 02/10/2021, 4:46 PM

## 2020-07-27 ENCOUNTER — Other Ambulatory Visit: Payer: Self-pay

## 2020-07-27 ENCOUNTER — Ambulatory Visit (INDEPENDENT_AMBULATORY_CARE_PROVIDER_SITE_OTHER): Payer: Medicaid Other | Admitting: Obstetrics and Gynecology

## 2020-07-27 ENCOUNTER — Encounter: Payer: Self-pay | Admitting: Obstetrics and Gynecology

## 2020-07-27 VITALS — BP 135/79 | HR 76 | Ht 67.0 in | Wt 158.7 lb

## 2020-07-27 DIAGNOSIS — N912 Amenorrhea, unspecified: Secondary | ICD-10-CM | POA: Diagnosis not present

## 2020-07-27 DIAGNOSIS — Z3201 Encounter for pregnancy test, result positive: Secondary | ICD-10-CM | POA: Diagnosis not present

## 2020-07-27 DIAGNOSIS — Z3200 Encounter for pregnancy test, result unknown: Secondary | ICD-10-CM

## 2020-07-27 LAB — POCT URINE PREGNANCY: Preg Test, Ur: POSITIVE — AB

## 2020-07-27 NOTE — Progress Notes (Signed)
HPI:      [name] is a 25 y.o. G1P0000 who LMP was Patient's last menstrual period was 05/28/2020.  Subjective:   She presents today because she has missed a menstrual period.  She has had some nausea and vomiting but is eating frequent small meals and this seems to be helping.  She is currently taking prenatal vitamins.    Hx: The following portions of the patient's history were reviewed and updated as appropriate:             She  has a past medical history of Asthma and Immunization, viral disease. She does not have a problem list on file. She  has a past surgical history that includes Wisdom tooth extraction (03/29/2014). Her family history includes Breast cancer in an other family member; Diabetes in her maternal grandfather, maternal grandmother, and mother; Hypertension in her maternal grandfather, maternal grandmother, and mother; Other in her maternal aunt. She  reports that she has never smoked. She has never used smokeless tobacco. She reports that she does not drink alcohol and does not use drugs. She has a current medication list which includes the following prescription(s): prenatal vitamin w/fe, fa. She has No Known Allergies.       Review of Systems:  Review of Systems  Constitutional: Denied constitutional symptoms, night sweats, recent illness, fatigue, fever, insomnia and weight loss.  Eyes: Denied eye symptoms, eye pain, photophobia, vision change and visual disturbance.  Ears/Nose/Throat/Neck: Denied ear, nose, throat or neck symptoms, hearing loss, nasal discharge, sinus congestion and sore throat.  Cardiovascular: Denied cardiovascular symptoms, arrhythmia, chest pain/pressure, edema, exercise intolerance, orthopnea and palpitations.  Respiratory: Denied pulmonary symptoms, asthma, pleuritic pain, productive sputum, cough, dyspnea and wheezing.  Gastrointestinal: Denied, gastro-esophageal reflux, melena, nausea and vomiting.  Genitourinary: Denied  genitourinary symptoms including symptomatic vaginal discharge, pelvic relaxation issues, and urinary complaints.  Musculoskeletal: Denied musculoskeletal symptoms, stiffness, swelling, muscle weakness and myalgia.  Dermatologic: Denied dermatology symptoms, rash and scar.  Neurologic: Denied neurology symptoms, dizziness, headache, neck pain and syncope.  Psychiatric: Denied psychiatric symptoms, anxiety and depression.  Endocrine: Denied endocrine symptoms including hot flashes and night sweats.   Meds:   Current Outpatient Medications on File Prior to Visit  Medication Sig Dispense Refill  . prenatal vitamin w/FE, FA (PRENATAL 1 + 1) 27-1 MG TABS tablet Take 1 tablet by mouth daily at 12 noon.     No current facility-administered medications on file prior to visit.          Objective:     Vitals:   07/27/20 0906  BP: 135/79  Pulse: 76   Filed Weights   07/27/20 0906  Weight: 158 lb 11.2 oz (72 kg)              Urinary beta-hCG positive  Assessment:    G1P0000 There are no problems to display for this patient.    1. Possible pregnancy, not confirmed     Approximate 8 weeks estimated gestational age based on last menstrual period.    Plan:            Prenatal Plan 1.  The patient was given prenatal literature. 2.  She was continued on prenatal vitamins. 3.  A prenatal lab panel to be drawn at nurse visit. 4.  An ultrasound was ordered to better determine an EDC. 5.  A nurse visit was scheduled. 6.  Genetic testing and testing for other inheritable conditions discussed in detail. She will decide in  the future whether to have these labs performed. 7.  A general overview of pregnancy testing, visit schedule, ultrasound schedule, and prenatal care was discussed. 8.  COVID and its risks associated with pregnancy, prevention by limiting exposure and use of masks, as well as the risks and benefits of vaccination during pregnancy were discussed in detail.  [facility]  policy regarding office and hospital visitation and testing was explained. 9.  Benefits of breast-feeding discussed in detail including both maternal and infant benefits. Ready Set Baby website discussed.   Orders Orders Placed This Encounter  Procedures  . US OB Comp Less 14 Wks    No orders of the defined types were placed in this encounter.     F/U  Return in about 5 weeks (around 08/31/2020). I spent 32 minutes involved in the care of this patient preparing to see the patient by obtaining and reviewing her medical history (including labs, imaging tests and prior procedures), documenting clinical information in the electronic health record (EHR), counseling and coordinating care plans, writing and sending prescriptions, ordering tests or procedures and directly communicating with the patient by discussing pertinent items from her history and physical exam as well as detailing my assessment and plan as noted above so that she has an informed understanding.  All of her questions were answered.  Elonda Husky, M.D. 07/27/2020 9:39 AM

## 2020-07-27 NOTE — Addendum Note (Signed)
Addended by: Dorian Pod on: 07/27/2020 09:44 AM   Modules accepted: Orders

## 2020-08-12 ENCOUNTER — Ambulatory Visit (INDEPENDENT_AMBULATORY_CARE_PROVIDER_SITE_OTHER): Payer: Medicaid Other | Admitting: Surgical

## 2020-08-12 ENCOUNTER — Other Ambulatory Visit: Payer: Self-pay

## 2020-08-12 VITALS — BP 138/78 | HR 82 | Ht 67.0 in | Wt 155.5 lb

## 2020-08-12 DIAGNOSIS — Z3491 Encounter for supervision of normal pregnancy, unspecified, first trimester: Secondary | ICD-10-CM

## 2020-08-12 NOTE — Progress Notes (Signed)
[name] presents for NOB nurse interview visit. Pregnancy confirmation done ___6/02/2020. G1. P-. Pregnancy education material explained and given. __1_ cats in home. Patient also works at [employer]. NOB labs ordered. HIV labs and drug screen were explained and ordered. PNV encouraged. Genetic screening options discussed. Genetic testing: Ordered.  Patient may discuss with the provider. Patient to follow up with provider 08/31/2020 for NOB physical. Patient is having trouble taking her PNV. I have given her different samples to try.

## 2020-08-13 LAB — MICROSCOPIC EXAMINATION
Bacteria, UA: NONE SEEN
Casts: NONE SEEN /lpf
RBC: NONE SEEN /hpf (ref 0–2)

## 2020-08-13 LAB — CBC WITH DIFFERENTIAL/PLATELET
Basophils Absolute: 0 10*3/uL (ref 0.0–0.2)
Basos: 0 %
EOS (ABSOLUTE): 0.1 10*3/uL (ref 0.0–0.4)
Eos: 1 %
Hematocrit: 38.7 % (ref 34.0–46.6)
Hemoglobin: 13.3 g/dL (ref 11.1–15.9)
Immature Grans (Abs): 0 10*3/uL (ref 0.0–0.1)
Immature Granulocytes: 0 %
Lymphocytes Absolute: 2.4 10*3/uL (ref 0.7–3.1)
Lymphs: 31 %
MCH: 30.5 pg (ref 26.6–33.0)
MCHC: 34.4 g/dL (ref 31.5–35.7)
MCV: 89 fL (ref 79–97)
Monocytes Absolute: 0.5 10*3/uL (ref 0.1–0.9)
Monocytes: 7 %
Neutrophils Absolute: 4.6 10*3/uL (ref 1.4–7.0)
Neutrophils: 61 %
Platelets: 295 10*3/uL (ref 150–450)
RBC: 4.36 x10E6/uL (ref 3.77–5.28)
RDW: 12.5 % (ref 11.7–15.4)
WBC: 7.6 10*3/uL (ref 3.4–10.8)

## 2020-08-13 LAB — RPR: RPR Ser Ql: NONREACTIVE

## 2020-08-13 LAB — URINALYSIS, ROUTINE W REFLEX MICROSCOPIC
Bilirubin, UA: NEGATIVE
Glucose, UA: NEGATIVE
Ketones, UA: NEGATIVE
Nitrite, UA: NEGATIVE
RBC, UA: NEGATIVE
Specific Gravity, UA: 1.022 (ref 1.005–1.030)
Urobilinogen, Ur: 0.2 mg/dL (ref 0.2–1.0)
pH, UA: 6.5 (ref 5.0–7.5)

## 2020-08-13 LAB — TOXOPLASMA ANTIBODIES- IGG AND  IGM
Toxoplasma Antibody- IgM: 9.7 AU/mL — ABNORMAL HIGH (ref 0.0–7.9)
Toxoplasma IgG Ratio: <3 IU/mL (ref 0.0–7.1)

## 2020-08-13 LAB — ABO AND RH: Rh Factor: POSITIVE

## 2020-08-13 LAB — ANTIBODY SCREEN: Antibody Screen: NEGATIVE

## 2020-08-13 LAB — HIV ANTIBODY (ROUTINE TESTING W REFLEX): HIV Screen 4th Generation wRfx: NONREACTIVE

## 2020-08-13 LAB — VARICELLA ZOSTER ANTIBODY, IGG: Varicella zoster IgG: 196 index (ref >165)

## 2020-08-13 LAB — VIRAL HEPATITIS HBV, HCV
HCV Ab: <0.1 s/co ratio (ref 0.0–0.9)
Hep B Core Total Ab: NEGATIVE
Hep B Surface Ab, Qual: REACTIVE
Hepatitis B Surface Ag: NEGATIVE

## 2020-08-13 LAB — RUBELLA SCREEN: Rubella Antibodies, IGG: 1.61 index (ref >0.99)

## 2020-08-13 LAB — HCV INTERPRETATION

## 2020-08-14 LAB — URINE CULTURE, OB REFLEX

## 2020-08-14 LAB — CULTURE, OB URINE

## 2020-08-15 LAB — GC/CHLAMYDIA PROBE AMP
Chlamydia trachomatis, NAA: NEGATIVE
Neisseria Gonorrhoeae by PCR: NEGATIVE

## 2020-08-16 ENCOUNTER — Other Ambulatory Visit: Payer: Self-pay

## 2020-08-16 DIAGNOSIS — T7589XD Other specified effects of external causes, subsequent encounter: Secondary | ICD-10-CM

## 2020-08-18 ENCOUNTER — Other Ambulatory Visit: Payer: Self-pay

## 2020-08-18 ENCOUNTER — Other Ambulatory Visit: Payer: Medicaid Other

## 2020-08-18 LAB — MONITOR DRUG PROFILE 14(MW)
Amphetamine Scrn, Ur: NEGATIVE ng/mL
BARBITURATE SCREEN URINE: NEGATIVE ng/mL
BENZODIAZEPINE SCREEN, URINE: NEGATIVE ng/mL
Buprenorphine, Urine: NEGATIVE ng/mL
Cocaine (Metab) Scrn, Ur: NEGATIVE ng/mL
Creatinine(Crt), U: 181 mg/dL (ref 20.0–300.0)
Fentanyl, Urine: NEGATIVE pg/mL
Meperidine Screen, Urine: NEGATIVE ng/mL
Methadone Screen, Urine: NEGATIVE ng/mL
OXYCODONE+OXYMORPHONE UR QL SCN: NEGATIVE ng/mL
Opiate Scrn, Ur: NEGATIVE ng/mL
Ph of Urine: 6.3 (ref 4.5–8.9)
Phencyclidine Qn, Ur: NEGATIVE ng/mL
Propoxyphene Scrn, Ur: NEGATIVE ng/mL
SPECIFIC GRAVITY: 1.023
Tramadol Screen, Urine: NEGATIVE ng/mL

## 2020-08-18 LAB — CANNABINOID (GC/MS), URINE
Cannabinoid: POSITIVE — AB
Carboxy THC (GC/MS): 534 ng/mL

## 2020-08-19 LAB — TOXOPLASMA ANTIBODIES- IGG AND  IGM
Toxoplasma Antibody- IgM: 10.5 AU/mL — ABNORMAL HIGH (ref 0.0–7.9)
Toxoplasma IgG Ratio: <3 IU/mL (ref 0.0–7.1)

## 2020-08-20 LAB — MATERNIT21  PLUS CORE+ESS+SCA, BLOOD
11q23 deletion (Jacobsen): NOT DETECTED
15q11 deletion (PW Angelman): NOT DETECTED
1p36 deletion syndrome: NOT DETECTED
22q11 deletion (DiGeorge): NOT DETECTED
4p16 deletion(Wolf-Hirschhorn): NOT DETECTED
5p15 deletion (Cri-du-chat): NOT DETECTED
8q24 deletion (Langer-Giedion): NOT DETECTED
Fetal Fraction: 11
Monosomy X (Turner Syndrome): NOT DETECTED
Result (T21): NEGATIVE
Trisomy 13 (Patau syndrome): NEGATIVE
Trisomy 16: NOT DETECTED
Trisomy 18 (Edwards syndrome): NEGATIVE
Trisomy 21 (Down syndrome): NEGATIVE
Trisomy 22: NOT DETECTED
XXX (Triple X Syndrome): NOT DETECTED
XXY (Klinefelter Syndrome): NOT DETECTED
XYY (Jacobs Syndrome): NOT DETECTED

## 2020-08-30 ENCOUNTER — Other Ambulatory Visit: Payer: Self-pay | Admitting: Obstetrics and Gynecology

## 2020-08-30 ENCOUNTER — Other Ambulatory Visit: Payer: Self-pay

## 2020-08-30 ENCOUNTER — Ambulatory Visit
Admission: RE | Admit: 2020-08-30 | Discharge: 2020-08-30 | Disposition: A | Discharge status: Home / Self Care | Payer: Medicaid Other | Source: Ambulatory Visit | Attending: Obstetrics and Gynecology | Admitting: Obstetrics and Gynecology

## 2020-08-30 DIAGNOSIS — O30009 Twin pregnancy, unspecified number of placenta and unspecified number of amniotic sacs, unspecified trimester: Secondary | ICD-10-CM | POA: Insufficient documentation

## 2020-08-30 DIAGNOSIS — Z3200 Encounter for pregnancy test, result unknown: Secondary | ICD-10-CM | POA: Diagnosis present

## 2020-08-30 DIAGNOSIS — Z3491 Encounter for supervision of normal pregnancy, unspecified, first trimester: Secondary | ICD-10-CM

## 2020-08-30 DIAGNOSIS — Z3689 Encounter for other specified antenatal screening: Secondary | ICD-10-CM

## 2020-08-31 ENCOUNTER — Encounter: Payer: Self-pay | Admitting: Obstetrics and Gynecology

## 2020-08-31 ENCOUNTER — Ambulatory Visit (INDEPENDENT_AMBULATORY_CARE_PROVIDER_SITE_OTHER): Payer: Medicaid Other | Admitting: Obstetrics and Gynecology

## 2020-08-31 VITALS — BP 112/69 | HR 80 | Wt 155.6 lb

## 2020-08-31 DIAGNOSIS — Z3401 Encounter for supervision of normal first pregnancy, first trimester: Secondary | ICD-10-CM

## 2020-08-31 DIAGNOSIS — Z3A12 12 weeks gestation of pregnancy: Secondary | ICD-10-CM

## 2020-08-31 NOTE — Progress Notes (Signed)
NOB: She has no complaints that she is doing well.  Have changed her EDC based on ultrasound.Mercie Eon twins) AFP next visit.  Anatomy ultrasound scheduled for 20 weeks  Physical examination General NAD, Conversant  HEENT Atraumatic; Op clear with mmm.  Normo-cephalic. Pupils reactive. Anicteric sclerae  Thyroid/Neck Smooth without nodularity or enlargement. Normal ROM.  Neck Supple.  Skin No rashes, lesions or ulceration. Normal palpated skin turgor. No nodularity.  Breasts: No masses or discharge.  Symmetric.  No axillary adenopathy.  Lungs: Clear to auscultation.No rales or wheezes. Normal Respiratory effort, no retractions.  Heart: NSR.  No murmurs or rubs appreciated. No periferal edema  Abdomen: Soft.  Non-tender.  No masses.  No HSM. No hernia  Extremities: Moves all appropriately.  Normal ROM for age. No lymphadenopathy.  Neuro: Oriented to PPT.  Normal mood. Normal affect.     Pelvic:   Vulva: Normal appearance.  No lesions.  Vagina: No lesions or abnormalities noted.  Support: Normal pelvic support.  Urethra No masses tenderness or scarring.  Meatus Normal size without lesions or prolapse.  Cervix: Normal appearance.  No lesions.  Anus: Normal exam.  No lesions.  Perineum: Normal exam.  No lesions.        Bimanual   Adnexae: No masses.  Non-tender to palpation.  Uterus: Enlarged. 14wks  Non-tender.  Mobile.  AV.  Adnexae: No masses.  Non-tender to palpation.  Cul-de-sac: Negative for abnormality.  Adnexae: No masses.  Non-tender to palpation.         Pelvimetry   Diagonal: Reached.  Spines: Average.  Sacrum: Concave.  Pubic Arch: Normal.

## 2020-09-01 ENCOUNTER — Telehealth: Payer: Self-pay | Admitting: Obstetrics and Gynecology

## 2020-09-01 NOTE — Telephone Encounter (Signed)
Pt called she recently had labs done that showed sex of baby as female- found out she is having twins. She is asking if both babies are female or if just one of the twins is a female. Please Advise she is setting up photo sessions and starting to plan nursery.

## 2020-09-02 NOTE — Telephone Encounter (Signed)
LM for patient to return call.

## 2020-09-09 ENCOUNTER — Telehealth: Payer: Self-pay | Admitting: Obstetrics and Gynecology

## 2020-09-09 NOTE — Telephone Encounter (Signed)
Pt is a little over [redacted] weeks pregnant and has tested positive for COVID.  Symptoms include sore throat, congestion, cough and headaches.  She has multiple questions and would like someone to call her back.

## 2020-09-19 ENCOUNTER — Telehealth: Payer: Self-pay | Admitting: Obstetrics and Gynecology

## 2020-09-19 NOTE — Telephone Encounter (Signed)
Pt calling back as directed by Asher Muir to get a rx sent in for prenatals. Pt requesting Concept DHA. Confirmed pharmacy as [facility] in [place]

## 2020-09-20 MED ORDER — CONCEPT DHA 53.5-38-1 MG PO CAPS: 1.0000 | ORAL_CAPSULE | Freq: Every day | ORAL | 11 refills | Status: DC

## 2020-09-20 NOTE — Telephone Encounter (Signed)
RX sent to the pharmacy  

## 2020-09-20 NOTE — Telephone Encounter (Signed)
LM for patient that I have sent in RX for vitamins to the pharmacy. If she has any questions to give me a call.

## 2020-09-21 ENCOUNTER — Telehealth: Payer: Self-pay | Admitting: Obstetrics and Gynecology

## 2020-09-21 NOTE — Telephone Encounter (Signed)
Jodi Chapman called in and stated that [facility] called her and told her that Concept DHA was on back order for a month and she is completely out.  Jodi Chapman wants to know [facility].  Jodi Chapman states if they don't have it then she would be ok with something else.  Please advise.

## 2020-09-22 ENCOUNTER — Other Ambulatory Visit: Payer: Self-pay | Admitting: Obstetrics and Gynecology

## 2020-09-22 MED ORDER — CONCEPT DHA 53.5-38-1 MG PO CAPS: 1.0000 | ORAL_CAPSULE | Freq: Every day | ORAL | 11 refills | Status: DC

## 2020-09-22 NOTE — Telephone Encounter (Signed)
Notified patient that RX has been sent to the pharmacy in [facility].

## 2020-09-28 ENCOUNTER — Ambulatory Visit (INDEPENDENT_AMBULATORY_CARE_PROVIDER_SITE_OTHER): Payer: Medicaid Other | Admitting: Obstetrics and Gynecology

## 2020-09-28 ENCOUNTER — Encounter: Payer: Self-pay | Admitting: Obstetrics and Gynecology

## 2020-09-28 ENCOUNTER — Other Ambulatory Visit: Payer: Self-pay

## 2020-09-28 VITALS — BP 100/66 | HR 81 | Wt 156.9 lb

## 2020-09-28 DIAGNOSIS — Z3A16 16 weeks gestation of pregnancy: Secondary | ICD-10-CM

## 2020-09-28 DIAGNOSIS — Z148 Genetic carrier of other disease: Secondary | ICD-10-CM

## 2020-09-28 DIAGNOSIS — O30042 Twin pregnancy, dichorionic/diamniotic, second trimester: Secondary | ICD-10-CM | POA: Insufficient documentation

## 2020-09-28 DIAGNOSIS — Z3402 Encounter for supervision of normal first pregnancy, second trimester: Secondary | ICD-10-CM

## 2020-09-28 LAB — POCT URINALYSIS DIPSTICK OB
Glucose, UA: NEGATIVE
Leukocytes, UA: NEGATIVE
POC,PROTEIN,UA: NEGATIVE
Spec Grav, UA: 1.025 (ref 1.010–1.025)
Urobilinogen, UA: 0.2 E.U./dL
pH, UA: 6 (ref 5.0–8.0)

## 2020-09-28 NOTE — Progress Notes (Signed)
ROB: Doing well, no major issues. Notes nausea/vomiting have improved. Has questions regarding diet at this time and things she should be consuming. Discussed healthy diet in pregnancy. Reviewed las, patient had MaterniT21 done, normal. For AFP today. Has anatomy scan scheduled. RTC in 4 weeks.

## 2020-09-28 NOTE — Progress Notes (Signed)
ROB: She is doing well, mild morning sickness. No new concerns.

## 2020-09-30 LAB — AFP, SERUM, OPEN SPINA BIFIDA
AFP MoM: 1.18
AFP Value: 41.4 ng/mL
Gest. Age on Collection Date: 16.3 weeks
Maternal Age At EDD: 24.2 yr
OSBR Risk 1 IN: 10000
Test Results:: NEGATIVE
Weight: 157 [lb_av]

## 2020-10-03 ENCOUNTER — Telehealth: Payer: Self-pay | Admitting: Obstetrics and Gynecology

## 2020-10-03 NOTE — Telephone Encounter (Signed)
Hessie Diener from [facility] called about maternity 21 test that was ordered by Dr.Cherry- states that pt already has results from same order from Dr.Evans in June. They would like to confirm the results and order to see how to proceed. Call back number is [phone].

## 2020-10-03 NOTE — Addendum Note (Signed)
Addended by: Silvano Bilis on: 10/03/2020 11:34 AM   Modules accepted: Orders

## 2020-10-04 LAB — AFP, SERUM, OPEN SPINA BIFIDA

## 2020-10-05 NOTE — Telephone Encounter (Signed)
Spoke to [facility] concerning their call to the office. I informed them that the last MaterniT21 order place has been canceled. [facility] stated that they would make changes to patient's MaterniT21 adding multiply pregnancies (twin pregnancy) to the order.

## 2020-10-11 ENCOUNTER — Other Ambulatory Visit: Payer: Self-pay | Admitting: Obstetrics and Gynecology

## 2020-10-11 DIAGNOSIS — O30042 Twin pregnancy, dichorionic/diamniotic, second trimester: Secondary | ICD-10-CM

## 2020-10-11 DIAGNOSIS — Z363 Encounter for antenatal screening for malformations: Secondary | ICD-10-CM

## 2020-10-13 ENCOUNTER — Other Ambulatory Visit: Payer: Self-pay

## 2020-10-13 ENCOUNTER — Other Ambulatory Visit: Payer: Self-pay | Admitting: Obstetrics and Gynecology

## 2020-10-13 ENCOUNTER — Telehealth: Payer: Self-pay | Admitting: Obstetrics and Gynecology

## 2020-10-13 ENCOUNTER — Ambulatory Visit: Payer: Medicaid Other | Attending: Obstetrics and Gynecology

## 2020-10-13 ENCOUNTER — Other Ambulatory Visit: Payer: Self-pay | Admitting: Maternal & Fetal Medicine

## 2020-10-13 DIAGNOSIS — Z3A18 18 weeks gestation of pregnancy: Secondary | ICD-10-CM | POA: Diagnosis not present

## 2020-10-13 DIAGNOSIS — O30042 Twin pregnancy, dichorionic/diamniotic, second trimester: Secondary | ICD-10-CM | POA: Insufficient documentation

## 2020-10-13 DIAGNOSIS — Z363 Encounter for antenatal screening for malformations: Secondary | ICD-10-CM | POA: Diagnosis not present

## 2020-10-13 DIAGNOSIS — O30002 Twin pregnancy, unspecified number of placenta and unspecified number of amniotic sacs, second trimester: Secondary | ICD-10-CM

## 2020-10-13 NOTE — Telephone Encounter (Signed)
Jodi Chapman states none of the pharmacies she is using can keep Concept DHA prenatal in stock.  Patient would like something else that is similar [facility] [place].

## 2020-10-14 ENCOUNTER — Other Ambulatory Visit: Payer: Self-pay | Admitting: Maternal & Fetal Medicine

## 2020-10-14 ENCOUNTER — Other Ambulatory Visit: Payer: Self-pay

## 2020-10-14 DIAGNOSIS — O30042 Twin pregnancy, dichorionic/diamniotic, second trimester: Secondary | ICD-10-CM

## 2020-10-14 DIAGNOSIS — O30002 Twin pregnancy, unspecified number of placenta and unspecified number of amniotic sacs, second trimester: Secondary | ICD-10-CM

## 2020-10-14 DIAGNOSIS — Z363 Encounter for antenatal screening for malformations: Secondary | ICD-10-CM

## 2020-10-14 MED ORDER — COMPLETENATE 29-1 MG PO CHEW: 1.0000 | CHEWABLE_TABLET | Freq: Every day | ORAL | 11 refills | Status: DC

## 2020-10-14 NOTE — Telephone Encounter (Signed)
Patient called no answer LM via VM that I sent in another RX for prenatal vitamins. Advised pt if she had any other issues to please let us know.

## 2020-10-26 ENCOUNTER — Encounter: Payer: Self-pay | Admitting: Obstetrics and Gynecology

## 2020-10-26 ENCOUNTER — Other Ambulatory Visit: Payer: Self-pay

## 2020-10-26 ENCOUNTER — Ambulatory Visit (INDEPENDENT_AMBULATORY_CARE_PROVIDER_SITE_OTHER): Payer: Medicaid Other | Admitting: Obstetrics and Gynecology

## 2020-10-26 VITALS — BP 106/71 | HR 73 | Wt 167.7 lb

## 2020-10-26 DIAGNOSIS — Z3A20 20 weeks gestation of pregnancy: Secondary | ICD-10-CM

## 2020-10-26 DIAGNOSIS — Z3402 Encounter for supervision of normal first pregnancy, second trimester: Secondary | ICD-10-CM

## 2020-10-26 DIAGNOSIS — O30042 Twin pregnancy, dichorionic/diamniotic, second trimester: Secondary | ICD-10-CM

## 2020-10-26 LAB — POCT URINALYSIS DIPSTICK OB
Bilirubin, UA: NEGATIVE
Blood, UA: NEGATIVE
Glucose, UA: NEGATIVE
Ketones, UA: NEGATIVE
Leukocytes, UA: NEGATIVE
Nitrite, UA: NEGATIVE
POC,PROTEIN,UA: NEGATIVE
Spec Grav, UA: 1.01 (ref 1.010–1.025)
Urobilinogen, UA: 0.2 E.U./dL
pH, UA: 7 (ref 5.0–8.0)

## 2020-10-26 NOTE — Progress Notes (Signed)
ROB: Patient says she is doing well.  Reports daily fetal movement.  Needs to complete anatomy scan- ultrasound discussed with patient.  Taking vitamins as directed.

## 2020-10-26 NOTE — Progress Notes (Signed)
ROB: Patient is doing well. She has no new concern today.

## 2020-10-27 ENCOUNTER — Telehealth: Payer: Self-pay | Admitting: Obstetrics and Gynecology

## 2020-10-27 NOTE — Telephone Encounter (Signed)
I spoke with [name] regarding NIPS testing in her current pregnancy. She had MaterniT21 testing which was negative earlier in the pregnancy.  The MaterniT21 platform can be used in a twin gestation to look for chromosome conditions and gender, but cannot assess zygosity of the twins.  We typically offer NIPS through the [facility] test [facility] laboratory) for twins, as their platform does allow for evaluation of the risk for chromosome conditions as well as zygosity.  However, given the fact that one NIPS test has been performed, insurance may not cover the repeat testing with [employer].  The out of pocket cost would likely be $250-350.  The patient can speak with our MFM at her next visit to determine if she would like this testing, or if/how their management of this pregnancy may differ based upon whether or not we know if the pregnancy is mono- or di-zygotic.  Of note, the first MaterniT21 result was interpreted for a singleton gestation rather than twins.  I reached out to [facility] and asked that they contact [facility] to change the information on the order to make sure the results correctly reflect the pregnancy.  This should not change the risk for Trisomy 69, 18 or 21, but will alter the interpretation for sex chromosome conditions.  Cherly Anderson, MS, CGC

## 2020-10-28 ENCOUNTER — Telehealth: Payer: Self-pay | Admitting: Obstetrics and Gynecology

## 2020-10-28 ENCOUNTER — Other Ambulatory Visit: Payer: Self-pay

## 2020-10-28 MED ORDER — PRENATAL VITAMIN/MIN +DHA 27-0.8-200 MG PO CAPS: 200.0000 mg | ORAL_CAPSULE | Freq: Every day | ORAL | 11 refills | Status: DC

## 2020-10-28 NOTE — Telephone Encounter (Signed)
[facility] form [place].  Patient is requesting a prenatal and does not want a chewable one.  Patient states she is getting frustrated because this has been an issue for about a month.  Please advise.

## 2020-10-28 NOTE — Telephone Encounter (Signed)
Jodi Chapman called back and stated [facility] [place] and she doesn't have a preference for a prenatal, as long as it is not chewable and has DHA in it.

## 2020-10-28 NOTE — Telephone Encounter (Signed)
LM asking pt to confirm the pharmacy and the PNV she prefers.

## 2020-10-28 NOTE — Telephone Encounter (Signed)
Pt aware  per VM PNV with [facility] erxed to [facility] [place] at 1243.   LM informing pt she could take PNV OTC if needed.

## 2020-11-03 ENCOUNTER — Other Ambulatory Visit: Payer: Self-pay

## 2020-11-03 DIAGNOSIS — O30042 Twin pregnancy, dichorionic/diamniotic, second trimester: Secondary | ICD-10-CM

## 2020-11-10 ENCOUNTER — Other Ambulatory Visit: Payer: Self-pay

## 2020-11-10 ENCOUNTER — Ambulatory Visit: Payer: Medicaid Other | Attending: Maternal & Fetal Medicine

## 2020-11-10 DIAGNOSIS — Z3A22 22 weeks gestation of pregnancy: Secondary | ICD-10-CM | POA: Diagnosis not present

## 2020-11-10 DIAGNOSIS — Z362 Encounter for other antenatal screening follow-up: Secondary | ICD-10-CM | POA: Diagnosis not present

## 2020-11-10 DIAGNOSIS — O30042 Twin pregnancy, dichorionic/diamniotic, second trimester: Secondary | ICD-10-CM | POA: Diagnosis not present

## 2020-11-15 LAB — MATERNIT21  PLUS CORE+ESS+SCA, BLOOD

## 2020-11-29 ENCOUNTER — Other Ambulatory Visit: Payer: Self-pay

## 2020-11-29 ENCOUNTER — Encounter: Payer: Self-pay | Admitting: Obstetrics and Gynecology

## 2020-11-29 ENCOUNTER — Other Ambulatory Visit: Payer: Self-pay | Admitting: Maternal & Fetal Medicine

## 2020-11-29 ENCOUNTER — Ambulatory Visit (INDEPENDENT_AMBULATORY_CARE_PROVIDER_SITE_OTHER): Payer: Medicaid Other | Admitting: Obstetrics and Gynecology

## 2020-11-29 VITALS — BP 128/78 | HR 83 | Wt 171.6 lb

## 2020-11-29 DIAGNOSIS — O30042 Twin pregnancy, dichorionic/diamniotic, second trimester: Secondary | ICD-10-CM

## 2020-11-29 DIAGNOSIS — Z3402 Encounter for supervision of normal first pregnancy, second trimester: Secondary | ICD-10-CM

## 2020-11-29 DIAGNOSIS — Z131 Encounter for screening for diabetes mellitus: Secondary | ICD-10-CM

## 2020-11-29 DIAGNOSIS — Z3A25 25 weeks gestation of pregnancy: Secondary | ICD-10-CM

## 2020-11-29 LAB — POCT URINALYSIS DIPSTICK OB
Bilirubin, UA: NEGATIVE
Blood, UA: NEGATIVE
Glucose, UA: NEGATIVE
Ketones, UA: NEGATIVE
Leukocytes, UA: NEGATIVE
Nitrite, UA: NEGATIVE
POC,PROTEIN,UA: NEGATIVE
Spec Grav, UA: 1.01 (ref 1.010–1.025)
Urobilinogen, UA: 0.2 E.U./dL
pH, UA: 6.5 (ref 5.0–8.0)

## 2020-11-29 NOTE — Progress Notes (Signed)
ROB: She is doing well, has no new concerns today. ?

## 2020-11-29 NOTE — Progress Notes (Signed)
ROB: Doing well. Noting lots of fetal movement now. Declines flu vaccine, also notes she got tetanus shot just prior to pregnancy so will decline Tdap in pregnancy. Has questions about newborn immunizations. RTC in 4 weeks, for 28 week labs that visit. Also for growth scan around that time.

## 2020-12-08 ENCOUNTER — Other Ambulatory Visit: Payer: Self-pay

## 2020-12-13 ENCOUNTER — Other Ambulatory Visit: Payer: Self-pay

## 2020-12-13 ENCOUNTER — Ambulatory Visit: Payer: Medicaid Other | Attending: Obstetrics

## 2020-12-13 DIAGNOSIS — O30042 Twin pregnancy, dichorionic/diamniotic, second trimester: Secondary | ICD-10-CM

## 2020-12-13 DIAGNOSIS — Z3A27 27 weeks gestation of pregnancy: Secondary | ICD-10-CM

## 2020-12-27 ENCOUNTER — Encounter: Payer: Medicaid Other | Admitting: Obstetrics and Gynecology

## 2020-12-27 ENCOUNTER — Other Ambulatory Visit: Payer: Self-pay

## 2020-12-27 ENCOUNTER — Ambulatory Visit (INDEPENDENT_AMBULATORY_CARE_PROVIDER_SITE_OTHER): Payer: Medicaid Other | Admitting: Obstetrics and Gynecology

## 2020-12-27 ENCOUNTER — Encounter: Payer: Self-pay | Admitting: Obstetrics and Gynecology

## 2020-12-27 VITALS — BP 111/79 | HR 88 | Wt 172.3 lb

## 2020-12-27 DIAGNOSIS — Z3403 Encounter for supervision of normal first pregnancy, third trimester: Secondary | ICD-10-CM

## 2020-12-27 DIAGNOSIS — Z3A29 29 weeks gestation of pregnancy: Secondary | ICD-10-CM

## 2020-12-27 LAB — POCT URINALYSIS DIPSTICK OB
Bilirubin, UA: NEGATIVE
Blood, UA: NEGATIVE
Glucose, UA: NEGATIVE
Ketones, UA: NEGATIVE
Leukocytes, UA: NEGATIVE
Nitrite, UA: NEGATIVE
POC,PROTEIN,UA: NEGATIVE
Spec Grav, UA: 1.01 (ref 1.010–1.025)
Urobilinogen, UA: 0.2 E.U./dL
pH, UA: 6.5 (ref 5.0–8.0)

## 2020-12-27 NOTE — Progress Notes (Signed)
ROB: She is having trouble sleeping and getting comfortable at night. No new concerns.

## 2020-12-27 NOTE — Progress Notes (Signed)
ROB: Reports active daily fetal movement.  States she occasionally feels lightheaded but has not passed.  We discussed continued increased hydration, no prolonged standing, and possible hypoglycemia and maintaining increased caloric intake.  1 hour GCT next week.  MFM ultrasound in 2 weeks for follow-up fetal growth twins.

## 2021-01-02 ENCOUNTER — Telehealth: Payer: Self-pay | Admitting: Obstetrics and Gynecology

## 2021-01-02 NOTE — Telephone Encounter (Signed)
Called patient. Advised her that discharge may increase in amount with pregnancy. It may become thicker and white in color. She is having some burning and irritation, so I advised her to try Monistat 7 to see if her symptoms resolved. If symptoms don't resolve let us know and we will call in Diflucan.

## 2021-01-02 NOTE — Telephone Encounter (Signed)
Pt is calling in stating that she is having a lot more discharge than normal and irritation and would like to be seen on 01/04/2021 when she come in for her labs.

## 2021-01-03 ENCOUNTER — Encounter: Payer: Medicaid Other | Admitting: Obstetrics and Gynecology

## 2021-01-04 ENCOUNTER — Other Ambulatory Visit: Payer: Medicaid Other

## 2021-01-04 ENCOUNTER — Other Ambulatory Visit: Payer: Self-pay

## 2021-01-04 DIAGNOSIS — Z3402 Encounter for supervision of normal first pregnancy, second trimester: Secondary | ICD-10-CM

## 2021-01-04 DIAGNOSIS — Z131 Encounter for screening for diabetes mellitus: Secondary | ICD-10-CM

## 2021-01-05 ENCOUNTER — Other Ambulatory Visit: Payer: Self-pay

## 2021-01-05 DIAGNOSIS — O30043 Twin pregnancy, dichorionic/diamniotic, third trimester: Secondary | ICD-10-CM

## 2021-01-05 DIAGNOSIS — O30042 Twin pregnancy, dichorionic/diamniotic, second trimester: Secondary | ICD-10-CM

## 2021-01-05 LAB — CBC
Hematocrit: 36.6 % (ref 34.0–46.6)
Hemoglobin: 12.7 g/dL (ref 11.1–15.9)
MCH: 30.7 pg (ref 26.6–33.0)
MCHC: 34.7 g/dL (ref 31.5–35.7)
MCV: 88 fL (ref 79–97)
Platelets: 226 10*3/uL (ref 150–450)
RBC: 4.14 x10E6/uL (ref 3.77–5.28)
RDW: 11.6 % — ABNORMAL LOW (ref 11.7–15.4)
WBC: 10.2 10*3/uL (ref 3.4–10.8)

## 2021-01-05 LAB — GLUCOSE, 1 HOUR GESTATIONAL: Gestational Diabetes Screen: 147 mg/dL — ABNORMAL HIGH (ref 70–139)

## 2021-01-10 ENCOUNTER — Other Ambulatory Visit: Payer: Self-pay

## 2021-01-10 ENCOUNTER — Ambulatory Visit: Payer: Medicaid Other | Attending: Maternal & Fetal Medicine

## 2021-01-10 DIAGNOSIS — Z3A31 31 weeks gestation of pregnancy: Secondary | ICD-10-CM | POA: Diagnosis not present

## 2021-01-10 DIAGNOSIS — O30043 Twin pregnancy, dichorionic/diamniotic, third trimester: Secondary | ICD-10-CM | POA: Diagnosis present

## 2021-01-10 DIAGNOSIS — O30042 Twin pregnancy, dichorionic/diamniotic, second trimester: Secondary | ICD-10-CM

## 2021-01-11 ENCOUNTER — Ambulatory Visit (INDEPENDENT_AMBULATORY_CARE_PROVIDER_SITE_OTHER): Payer: Medicaid Other | Admitting: Obstetrics and Gynecology

## 2021-01-11 ENCOUNTER — Encounter: Payer: Self-pay | Admitting: Obstetrics and Gynecology

## 2021-01-11 VITALS — BP 116/76 | HR 83 | Wt 176.6 lb

## 2021-01-11 DIAGNOSIS — Z3A31 31 weeks gestation of pregnancy: Secondary | ICD-10-CM

## 2021-01-11 DIAGNOSIS — Z3403 Encounter for supervision of normal first pregnancy, third trimester: Secondary | ICD-10-CM

## 2021-01-11 DIAGNOSIS — O30042 Twin pregnancy, dichorionic/diamniotic, second trimester: Secondary | ICD-10-CM

## 2021-01-11 DIAGNOSIS — O479 False labor, unspecified: Secondary | ICD-10-CM

## 2021-01-11 LAB — POCT URINALYSIS DIPSTICK OB
Bilirubin, UA: NEGATIVE
Blood, UA: NEGATIVE
Glucose, UA: NEGATIVE
Ketones, UA: NEGATIVE
Leukocytes, UA: NEGATIVE
Nitrite, UA: NEGATIVE
POC,PROTEIN,UA: NEGATIVE
Spec Grav, UA: 1.02 (ref 1.010–1.025)
Urobilinogen, UA: 0.2 E.U./dL
pH, UA: 6.5 (ref 5.0–8.0)

## 2021-01-11 NOTE — Patient Instructions (Signed)
GESTATIONAL DIABETES TESTING FOR PREGNANCY  Pregnant women can develop a condition known as Gestational Diabetes (diabetes brought on by pregnancy) which can pose a risk to both mother and baby. A glucose tolerance test is a common type of testing for potential gestational diabetes.  There are several tests intended to identify gestational diabetes in pregnant women. The first, called the Glucose Challenge Screening, is a preliminary screening test performed between 26-28 weeks. If a woman tests positive during this screening test, the second test, called the Glucose Tolerance Test, may be performed. This test will diagnose whether diabetes exists or not by indicating whether or not the body is using glucose (a type of sugar) effectively.  The Glucose Challenge Screening is now considered to be a standard test performed during the early part of the third trimester of pregnancy.  What is the Glucose Challenge Screening Test? No preparation is required prior to the test. During the test, the mother is asked to drink a sweet liquid (glucose) and then will have blood drawn one hour from having the drink, as blood glucose levels normally peak within one hour. No fasting is required prior to this test.  The test evaluates how your body processes sugar. A high level in your blood may indicate your body is not processing sugar effectively (positive test). If the results of this screen are positive, the woman may have the Glucose Tolerance Test performed. It is important to note that not all women who test positive for the Glucose Challenge Screening test are found to have diabetes upon further diagnosis.  What is the Glucose Tolerance Test? Prior to the taking the glucose tolerance test, your doctor will ask you to make sure and eat at least 150mg of carbohydrates (about what you will get from a slice or two of bread) for three days prior to the time you will be asked to fast. You will not be permitted to eat  or drink anything but sips of water for 14 hours prior to the test, so it is best to schedule the test for first thing in the morning.  Additionally, you should plan to have someone drive you to and from the test since your energy levels may be low and there is a slight possibility you may feel light-headed.  When you arrive, the technician will draw blood to measure your baseline "fasting blood glucose level". You will be asked to drink a larger volume (or more concentrated solution) of the glucose drink than was used in the initial Glucose Challenge Screening test. Your blood will be drawn and tested every hour for the next three hours.  The following are the values that the American Diabetes Association considers to be abnormal during the Glucose Tolerance Test:  Interval Abnormal reading Fasting 95 mg/dl or higher One hour 180 mg/dl or higher Two hours 155 mg/dl or higher Three hours 140 mg/dl or higher  What if my Glucose Tolerance Test Results are Abnormal? If only one of your readings comes back abnormal, your doctor may suggest some changes to your diet and/or test you again later in the pregnancy. If two or more of your readings come back abnormal, you'll be diagnosed with Gestational Diabetes and your doctor or midwife will talk to you about a treatment plan. Treating diabetes during pregnancy is extremely important to protect the health of both mother and baby.   Compiled using information from the following sources:  1. American Diabetes Association  https://www.diabetes.org  2. [employer]  https://www.emedicine.com    3. National Institute of Diabetes and Digestive and Kidney Diseases  

## 2021-01-11 NOTE — Progress Notes (Signed)
ROB: Patient notes some cramping, thinks they are Jodi Chapman. Reviewed recent US from MFM yesterday, growth with minimal discordance (Twin A 18%ile and Twin B 17%ile).  Continue serial growth scans. Patient has questions regarding delivery recommendations. Discussed ACOG guidelines of delivery by 38 weeks. Patient notes she would prefer not be induced unless necessary. Discussed that we could continue antenatal testing beyond 38 weeks as long as growth remains wnl. Discussed indications for C/S with twin pregnancy. Discussed pain management in labor, desires to hold off on epidural for as long as possible. Advised on early pain management options. Needs 3 hr GTT for abnormal 1 hr testing. Unsure of contraceptive desires, given handout.  Discussed breastfeeding.  RTC in 2 weeks.

## 2021-01-11 NOTE — Progress Notes (Signed)
ROB: She has been having some cramping, otherwise she is doing well.

## 2021-01-13 ENCOUNTER — Other Ambulatory Visit: Payer: Self-pay

## 2021-01-13 DIAGNOSIS — Z131 Encounter for screening for diabetes mellitus: Secondary | ICD-10-CM

## 2021-01-16 ENCOUNTER — Other Ambulatory Visit: Payer: Medicaid Other

## 2021-01-16 ENCOUNTER — Other Ambulatory Visit: Payer: Self-pay

## 2021-01-16 DIAGNOSIS — Z131 Encounter for screening for diabetes mellitus: Secondary | ICD-10-CM

## 2021-01-17 LAB — GESTATIONAL GLUCOSE TOLERANCE
Glucose, Fasting: 74 mg/dL (ref 70–94)
Glucose, GTT - 1 Hour: 102 mg/dL (ref 70–179)
Glucose, GTT - 2 Hour: 103 mg/dL (ref 70–154)
Glucose, GTT - 3 Hour: 100 mg/dL (ref 70–139)

## 2021-01-31 ENCOUNTER — Other Ambulatory Visit: Payer: Self-pay | Admitting: *Deleted

## 2021-01-31 ENCOUNTER — Encounter: Payer: Self-pay | Admitting: *Deleted

## 2021-01-31 ENCOUNTER — Ambulatory Visit: Payer: Medicaid Other | Admitting: *Deleted

## 2021-01-31 ENCOUNTER — Other Ambulatory Visit: Payer: Self-pay

## 2021-01-31 ENCOUNTER — Other Ambulatory Visit: Payer: Self-pay | Admitting: Obstetrics and Gynecology

## 2021-01-31 ENCOUNTER — Ambulatory Visit: Payer: Medicaid Other | Attending: Maternal & Fetal Medicine

## 2021-01-31 VITALS — BP 160/83 | HR 66

## 2021-01-31 DIAGNOSIS — O30049 Twin pregnancy, dichorionic/diamniotic, unspecified trimester: Secondary | ICD-10-CM | POA: Insufficient documentation

## 2021-01-31 DIAGNOSIS — Z3A34 34 weeks gestation of pregnancy: Secondary | ICD-10-CM | POA: Diagnosis not present

## 2021-01-31 DIAGNOSIS — O30043 Twin pregnancy, dichorionic/diamniotic, third trimester: Secondary | ICD-10-CM

## 2021-01-31 NOTE — Progress Notes (Signed)
Pt c/o headache yesterday and intermittent floaters for weeks. Both feet and ankles quite swollen with 3+ pitting edema. No edema noticed in hands or face.

## 2021-02-01 ENCOUNTER — Encounter: Payer: Self-pay | Admitting: Obstetrics and Gynecology

## 2021-02-01 ENCOUNTER — Ambulatory Visit (INDEPENDENT_AMBULATORY_CARE_PROVIDER_SITE_OTHER): Payer: Medicaid Other | Admitting: Obstetrics and Gynecology

## 2021-02-01 VITALS — BP 137/85 | HR 69

## 2021-02-01 DIAGNOSIS — Z3403 Encounter for supervision of normal first pregnancy, third trimester: Secondary | ICD-10-CM

## 2021-02-01 DIAGNOSIS — Z3A34 34 weeks gestation of pregnancy: Secondary | ICD-10-CM

## 2021-02-01 NOTE — Progress Notes (Signed)
ROB: She has concerns with the swelling in her feet. She having some visual disturbances, seeing floaters. Blood pressure was elevated at U/S appointment yesterday.

## 2021-02-01 NOTE — Progress Notes (Signed)
ROB: She has no complaints.  When questioned she states that she has some swelling in her feet and occasionally sees "floaters".  Her blood pressure was elevated yesterday at MFM but then calmed down while still there.  Growth was 15th percentile and 19th percentile.  Blood pressure today is good.  Negative proteinuria.  Doubt preeclampsia at this time but will continue to follow closely.  Signs and symptoms discussed.  Patient to keep MFM appointments weekly as scheduled.  Cultures here in 2 weeks.  Timing of delivery per discussion with MFM.  Both babies vertex patient wants vaginal delivery if possible.

## 2021-02-06 ENCOUNTER — Telehealth: Payer: Self-pay | Admitting: Obstetrics and Gynecology

## 2021-02-06 NOTE — Telephone Encounter (Signed)
Apt called stating hat lost her mucus plug last night, has been having back pain and abdominal pain. Pt denies any bleeding. Pt has MFM apt in morning at 715 in [place]. After consulting clinical staff directed patient to keep apt with MFM- if any bleeding or worsening in pain, needs to go to ER. I made pt aware if there were any cancellations I will try to move pt to a sooner apt with Dr.Cherry.

## 2021-02-07 ENCOUNTER — Ambulatory Visit: Payer: Medicaid Other | Admitting: *Deleted

## 2021-02-07 ENCOUNTER — Ambulatory Visit: Payer: Medicaid Other | Attending: Obstetrics

## 2021-02-07 ENCOUNTER — Other Ambulatory Visit: Payer: Self-pay

## 2021-02-07 VITALS — BP 146/93 | HR 76

## 2021-02-07 DIAGNOSIS — O30043 Twin pregnancy, dichorionic/diamniotic, third trimester: Secondary | ICD-10-CM

## 2021-02-07 DIAGNOSIS — Z3A35 35 weeks gestation of pregnancy: Secondary | ICD-10-CM

## 2021-02-10 ENCOUNTER — Encounter: Payer: Medicaid Other | Admitting: Obstetrics and Gynecology

## 2021-02-10 ENCOUNTER — Inpatient Hospital Stay
Admission: EM | Admit: 2021-02-10 | Discharge: 2021-02-13 | DRG: 807 | Disposition: A | Discharge status: Home / Self Care | Payer: Medicaid Other | Attending: Obstetrics and Gynecology | Admitting: Obstetrics and Gynecology

## 2021-02-10 ENCOUNTER — Other Ambulatory Visit: Payer: Self-pay

## 2021-02-10 ENCOUNTER — Inpatient Hospital Stay: Payer: Medicaid Other | Admitting: Anesthesiology

## 2021-02-10 ENCOUNTER — Encounter: Payer: Self-pay | Admitting: Obstetrics and Gynecology

## 2021-02-10 DIAGNOSIS — O30042 Twin pregnancy, dichorionic/diamniotic, second trimester: Secondary | ICD-10-CM | POA: Diagnosis present

## 2021-02-10 DIAGNOSIS — D500 Iron deficiency anemia secondary to blood loss (chronic): Secondary | ICD-10-CM | POA: Diagnosis not present

## 2021-02-10 DIAGNOSIS — Z3A35 35 weeks gestation of pregnancy: Secondary | ICD-10-CM | POA: Diagnosis not present

## 2021-02-10 DIAGNOSIS — O133 Gestational [pregnancy-induced] hypertension without significant proteinuria, third trimester: Secondary | ICD-10-CM

## 2021-02-10 DIAGNOSIS — O9089 Other complications of the puerperium, not elsewhere classified: Secondary | ICD-10-CM | POA: Diagnosis not present

## 2021-02-10 DIAGNOSIS — O30043 Twin pregnancy, dichorionic/diamniotic, third trimester: Secondary | ICD-10-CM | POA: Diagnosis present

## 2021-02-10 DIAGNOSIS — Z20822 Contact with and (suspected) exposure to covid-19: Secondary | ICD-10-CM | POA: Diagnosis present

## 2021-02-10 DIAGNOSIS — D509 Iron deficiency anemia, unspecified: Secondary | ICD-10-CM | POA: Diagnosis not present

## 2021-02-10 DIAGNOSIS — O9081 Anemia of the puerperium: Secondary | ICD-10-CM | POA: Diagnosis not present

## 2021-02-10 DIAGNOSIS — R609 Edema, unspecified: Secondary | ICD-10-CM

## 2021-02-10 DIAGNOSIS — O134 Gestational [pregnancy-induced] hypertension without significant proteinuria, complicating childbirth: Secondary | ICD-10-CM | POA: Diagnosis present

## 2021-02-10 DIAGNOSIS — Z7982 Long term (current) use of aspirin: Secondary | ICD-10-CM | POA: Diagnosis not present

## 2021-02-10 DIAGNOSIS — Z8759 Personal history of other complications of pregnancy, childbirth and the puerperium: Secondary | ICD-10-CM | POA: Diagnosis present

## 2021-02-10 DIAGNOSIS — O9903 Anemia complicating the puerperium: Secondary | ICD-10-CM | POA: Diagnosis not present

## 2021-02-10 DIAGNOSIS — O139 Gestational [pregnancy-induced] hypertension without significant proteinuria, unspecified trimester: Secondary | ICD-10-CM | POA: Diagnosis present

## 2021-02-10 LAB — COMPREHENSIVE METABOLIC PANEL
ALT: 13 U/L (ref 0–44)
AST: 23 U/L (ref 15–41)
Albumin: 2.9 g/dL — ABNORMAL LOW (ref 3.5–5.0)
Alkaline Phosphatase: 172 U/L — ABNORMAL HIGH (ref 38–126)
Anion gap: 8 (ref 5–15)
BUN: 9 mg/dL (ref 6–20)
CO2: 18 mmol/L — ABNORMAL LOW (ref 22–32)
Calcium: 8.5 mg/dL — ABNORMAL LOW (ref 8.9–10.3)
Chloride: 111 mmol/L (ref 98–111)
Creatinine, Ser: 1.26 mg/dL — ABNORMAL HIGH (ref 0.44–1.00)
GFR, Estimated: >60 mL/min (ref >60)
Glucose, Bld: 80 mg/dL (ref 70–99)
Potassium: 3.7 mmol/L (ref 3.5–5.1)
Sodium: 137 mmol/L (ref 135–145)
Total Bilirubin: 0.8 mg/dL (ref 0.3–1.2)
Total Protein: 6.9 g/dL (ref 6.5–8.1)

## 2021-02-10 LAB — CHLAMYDIA/NGC RT PCR (ARMC ONLY)
Chlamydia Tr: NOT DETECTED
N gonorrhoeae: NOT DETECTED

## 2021-02-10 LAB — URINALYSIS, ROUTINE W REFLEX MICROSCOPIC
Bilirubin Urine: NEGATIVE
Glucose, UA: NEGATIVE mg/dL
Ketones, ur: NEGATIVE mg/dL
Leukocytes,Ua: NEGATIVE
Nitrite: NEGATIVE
Protein, ur: NEGATIVE mg/dL
Specific Gravity, Urine: 1.01 (ref 1.005–1.030)
pH: 5.5 (ref 5.0–8.0)

## 2021-02-10 LAB — CBC
HCT: 33.9 % — ABNORMAL LOW (ref 36.0–46.0)
Hemoglobin: 11.7 g/dL — ABNORMAL LOW (ref 12.0–15.0)
MCH: 28.5 pg (ref 26.0–34.0)
MCHC: 34.5 g/dL (ref 30.0–36.0)
MCV: 82.5 fL (ref 80.0–100.0)
Platelets: 272 10*3/uL (ref 150–400)
RBC: 4.11 MIL/uL (ref 3.87–5.11)
RDW: 12.7 % (ref 11.5–15.5)
WBC: 10.7 10*3/uL — ABNORMAL HIGH (ref 4.0–10.5)
nRBC: 0 % (ref 0.0–0.2)

## 2021-02-10 LAB — URINALYSIS, MICROSCOPIC (REFLEX): Squamous Epithelial / HPF: NONE SEEN (ref 0–5)

## 2021-02-10 LAB — PROTEIN / CREATININE RATIO, URINE
Creatinine, Urine: 129 mg/dL
Protein Creatinine Ratio: 0.09 mg/mg{Cre} (ref 0.00–0.15)
Total Protein, Urine: 12 mg/dL

## 2021-02-10 LAB — RESP PANEL BY RT-PCR (FLU A&B, COVID) ARPGX2
Influenza A by PCR: NEGATIVE
Influenza B by PCR: NEGATIVE
SARS Coronavirus 2 by RT PCR: NEGATIVE

## 2021-02-10 LAB — TYPE AND SCREEN
ABO/RH(D): A POS
Antibody Screen: NEGATIVE

## 2021-02-10 LAB — ABO/RH: ABO/RH(D): A POS

## 2021-02-10 MED ORDER — BUPIVACAINE HCL (PF) 0.25 % IJ SOLN
INTRAMUSCULAR | Status: DC | PRN
  Administered 2021-02-10: 1 mL via EPIDURAL

## 2021-02-10 MED ORDER — DIPHENHYDRAMINE HCL 50 MG/ML IJ SOLN: 12.5000 mg | INTRAMUSCULAR | Status: DC | PRN

## 2021-02-10 MED ORDER — ACETAMINOPHEN 325 MG PO TABS
650.0000 mg | ORAL_TABLET | ORAL | Status: DC | PRN
  Filled 2021-02-10: qty 2

## 2021-02-10 MED ORDER — PHENYLEPHRINE 40 MCG/ML (10ML) SYRINGE FOR IV PUSH (FOR BLOOD PRESSURE SUPPORT)
80.0000 ug | PREFILLED_SYRINGE | INTRAVENOUS | Status: DC | PRN
  Filled 2021-02-10: qty 10

## 2021-02-10 MED ORDER — SOD CITRATE-CITRIC ACID 500-334 MG/5ML PO SOLN: 30.0000 mL | ORAL | Status: DC | PRN

## 2021-02-10 MED ORDER — SODIUM CHLORIDE 0.9 % IV SOLN
2.0000 g | Freq: Once | INTRAVENOUS | Status: AC
  Administered 2021-02-10: 2 g via INTRAVENOUS
  Filled 2021-02-10: qty 2000

## 2021-02-10 MED ORDER — MISOPROSTOL 200 MCG PO TABS
ORAL_TABLET | ORAL | Status: AC
  Administered 2021-02-10: 800 ug via BUCCAL
  Filled 2021-02-10: qty 4

## 2021-02-10 MED ORDER — ZOLPIDEM TARTRATE 5 MG PO TABS: 5.0000 mg | ORAL_TABLET | Freq: Every evening | ORAL | Status: DC | PRN

## 2021-02-10 MED ORDER — LIDOCAINE HCL (PF) 1 % IJ SOLN
INTRAMUSCULAR | Status: AC
  Filled 2021-02-10: qty 30

## 2021-02-10 MED ORDER — ONDANSETRON HCL 4 MG PO TABS: 4.0000 mg | ORAL_TABLET | ORAL | Status: DC | PRN

## 2021-02-10 MED ORDER — OXYCODONE-ACETAMINOPHEN 5-325 MG PO TABS: 1.0000 | ORAL_TABLET | ORAL | Status: DC | PRN

## 2021-02-10 MED ORDER — ONDANSETRON HCL 4 MG/2ML IJ SOLN: 4.0000 mg | Freq: Four times a day (QID) | INTRAMUSCULAR | Status: DC | PRN

## 2021-02-10 MED ORDER — LACTATED RINGERS IV SOLN: 500.0000 mL | Freq: Once | INTRAVENOUS | Status: DC

## 2021-02-10 MED ORDER — BENZOCAINE-MENTHOL 20-0.5 % EX AERO
INHALATION_SPRAY | CUTANEOUS | Status: AC
  Administered 2021-02-10: 1 via TOPICAL
  Filled 2021-02-10: qty 56

## 2021-02-10 MED ORDER — OXYTOCIN-SODIUM CHLORIDE 30-0.9 UT/500ML-% IV SOLN
2.5000 [IU]/h | INTRAVENOUS | Status: DC
  Administered 2021-02-10 (×2): 2.5 [IU]/h via INTRAVENOUS

## 2021-02-10 MED ORDER — DIBUCAINE (PERIANAL) 1 % EX OINT
TOPICAL_OINTMENT | CUTANEOUS | Status: AC
  Administered 2021-02-10: 1 via RECTAL
  Filled 2021-02-10: qty 28

## 2021-02-10 MED ORDER — LABETALOL HCL 5 MG/ML IV SOLN
INTRAVENOUS | Status: AC
  Filled 2021-02-10: qty 4

## 2021-02-10 MED ORDER — OXYTOCIN 10 UNIT/ML IJ SOLN
INTRAMUSCULAR | Status: AC
  Filled 2021-02-10: qty 2

## 2021-02-10 MED ORDER — EPHEDRINE 5 MG/ML INJ
10.0000 mg | INTRAVENOUS | Status: DC | PRN
  Filled 2021-02-10: qty 2

## 2021-02-10 MED ORDER — DIBUCAINE (PERIANAL) 1 % EX OINT
1.0000 "application " | TOPICAL_OINTMENT | CUTANEOUS | Status: DC | PRN
  Filled 2021-02-10 (×3): qty 28

## 2021-02-10 MED ORDER — LACTATED RINGERS IV SOLN: 500.0000 mL | INTRAVENOUS | Status: DC | PRN

## 2021-02-10 MED ORDER — LABETALOL HCL 5 MG/ML IV SOLN
40.0000 mg | INTRAVENOUS | Status: DC | PRN
  Administered 2021-02-10: 40 mg via INTRAVENOUS
  Filled 2021-02-10: qty 8

## 2021-02-10 MED ORDER — FUROSEMIDE 20 MG PO TABS
20.0000 mg | ORAL_TABLET | Freq: Every day | ORAL | Status: DC
  Administered 2021-02-11 – 2021-02-13 (×3): 20 mg via ORAL
  Filled 2021-02-10 (×3): qty 1

## 2021-02-10 MED ORDER — LIDOCAINE HCL (PF) 1 % IJ SOLN
30.0000 mL | INTRAMUSCULAR | Status: DC | PRN
  Filled 2021-02-10: qty 30

## 2021-02-10 MED ORDER — OXYTOCIN-SODIUM CHLORIDE 30-0.9 UT/500ML-% IV SOLN: 2.5000 [IU]/h | INTRAVENOUS | Status: DC

## 2021-02-10 MED ORDER — SODIUM CHLORIDE 0.9 % IV SOLN
1.0000 g | INTRAVENOUS | Status: DC
  Administered 2021-02-10: 1 g via INTRAVENOUS
  Filled 2021-02-10 (×6): qty 1000

## 2021-02-10 MED ORDER — SODIUM CHLORIDE 0.9 % IV SOLN: 1.0000 g | INTRAVENOUS | Status: DC

## 2021-02-10 MED ORDER — LIDOCAINE HCL (PF) 1 % IJ SOLN: 30.0000 mL | INTRAMUSCULAR | Status: DC | PRN

## 2021-02-10 MED ORDER — LACTATED RINGERS IV SOLN: INTRAVENOUS | Status: DC

## 2021-02-10 MED ORDER — BENZOCAINE-MENTHOL 20-0.5 % EX AERO
1.0000 "application " | INHALATION_SPRAY | CUTANEOUS | Status: DC | PRN
  Filled 2021-02-10 (×2): qty 56

## 2021-02-10 MED ORDER — SIMETHICONE 80 MG PO CHEW
80.0000 mg | CHEWABLE_TABLET | ORAL | Status: DC | PRN
  Administered 2021-02-11: 80 mg via ORAL
  Filled 2021-02-10: qty 1

## 2021-02-10 MED ORDER — LIDOCAINE HCL (PF) 1 % IJ SOLN
INTRAMUSCULAR | Status: DC | PRN
  Administered 2021-02-10: 2 mL

## 2021-02-10 MED ORDER — WITCH HAZEL-GLYCERIN EX PADS
1.0000 "application " | MEDICATED_PAD | CUTANEOUS | Status: DC | PRN
  Filled 2021-02-10 (×3): qty 100

## 2021-02-10 MED ORDER — AMLODIPINE BESYLATE 5 MG PO TABS
5.0000 mg | ORAL_TABLET | Freq: Every day | ORAL | Status: DC
  Administered 2021-02-10 – 2021-02-11 (×2): 5 mg via ORAL
  Filled 2021-02-10 (×2): qty 1

## 2021-02-10 MED ORDER — OXYCODONE-ACETAMINOPHEN 5-325 MG PO TABS: 2.0000 | ORAL_TABLET | ORAL | Status: DC | PRN

## 2021-02-10 MED ORDER — BUTORPHANOL TARTRATE 1 MG/ML IJ SOLN: 1.0000 mg | INTRAMUSCULAR | Status: DC | PRN

## 2021-02-10 MED ORDER — FENTANYL-BUPIVACAINE-NACL 0.5-0.125-0.9 MG/250ML-% EP SOLN: 12.0000 mL/h | EPIDURAL | Status: DC | PRN

## 2021-02-10 MED ORDER — HYDRALAZINE HCL 20 MG/ML IJ SOLN
10.0000 mg | INTRAMUSCULAR | Status: DC | PRN
  Administered 2021-02-10: 10 mg via INTRAVENOUS

## 2021-02-10 MED ORDER — FENTANYL-BUPIVACAINE-NACL 0.5-0.125-0.9 MG/250ML-% EP SOLN
EPIDURAL | Status: AC
  Filled 2021-02-10: qty 250

## 2021-02-10 MED ORDER — ACETAMINOPHEN 325 MG PO TABS
650.0000 mg | ORAL_TABLET | ORAL | Status: DC | PRN
  Administered 2021-02-10 – 2021-02-12 (×5): 650 mg via ORAL
  Filled 2021-02-10 (×4): qty 2

## 2021-02-10 MED ORDER — PRENATAL MULTIVITAMIN CH
1.0000 | ORAL_TABLET | Freq: Every day | ORAL | Status: DC
  Administered 2021-02-11 – 2021-02-12 (×2): 1 via ORAL
  Filled 2021-02-10 (×2): qty 1

## 2021-02-10 MED ORDER — OXYTOCIN BOLUS FROM INFUSION
333.0000 mL | Freq: Once | INTRAVENOUS | Status: AC
  Administered 2021-02-10: 333 mL via INTRAVENOUS

## 2021-02-10 MED ORDER — HYDRALAZINE HCL 20 MG/ML IJ SOLN
INTRAMUSCULAR | Status: AC
  Filled 2021-02-10: qty 1

## 2021-02-10 MED ORDER — ACETAMINOPHEN 325 MG PO TABS: 650.0000 mg | ORAL_TABLET | ORAL | Status: DC | PRN

## 2021-02-10 MED ORDER — LABETALOL HCL 5 MG/ML IV SOLN
80.0000 mg | INTRAVENOUS | Status: DC | PRN
  Administered 2021-02-10: 80 mg via INTRAVENOUS
  Filled 2021-02-10: qty 16

## 2021-02-10 MED ORDER — DIPHENHYDRAMINE HCL 25 MG PO CAPS: 25.0000 mg | ORAL_CAPSULE | Freq: Four times a day (QID) | ORAL | Status: DC | PRN

## 2021-02-10 MED ORDER — FENTANYL-BUPIVACAINE-NACL 0.5-0.125-0.9 MG/250ML-% EP SOLN
EPIDURAL | Status: DC | PRN
  Administered 2021-02-10: 12 mL/h via EPIDURAL

## 2021-02-10 MED ORDER — MISOPROSTOL 200 MCG PO TABS: 800.0000 ug | ORAL_TABLET | Freq: Once | ORAL | Status: AC

## 2021-02-10 MED ORDER — ONDANSETRON HCL 4 MG/2ML IJ SOLN: 4.0000 mg | INTRAMUSCULAR | Status: DC | PRN

## 2021-02-10 MED ORDER — AMMONIA AROMATIC IN INHA
RESPIRATORY_TRACT | Status: AC
  Filled 2021-02-10: qty 10

## 2021-02-10 MED ORDER — WITCH HAZEL-GLYCERIN EX PADS
MEDICATED_PAD | CUTANEOUS | Status: AC
  Administered 2021-02-10: 1 via TOPICAL
  Filled 2021-02-10: qty 100

## 2021-02-10 MED ORDER — LIDOCAINE-EPINEPHRINE (PF) 1.5 %-1:200000 IJ SOLN
INTRAMUSCULAR | Status: DC | PRN
  Administered 2021-02-10: 3 mL via PERINEURAL

## 2021-02-10 MED ORDER — COCONUT OIL OIL
1.0000 "application " | TOPICAL_OIL | Status: DC | PRN
  Filled 2021-02-10: qty 120

## 2021-02-10 MED ORDER — TETANUS-DIPHTH-ACELL PERTUSSIS 5-2.5-18.5 LF-MCG/0.5 IM SUSY
0.5000 mL | PREFILLED_SYRINGE | Freq: Once | INTRAMUSCULAR | Status: DC
  Filled 2021-02-10: qty 0.5

## 2021-02-10 MED ORDER — SODIUM CHLORIDE 0.9 % IV SOLN: 2.0000 g | Freq: Once | INTRAVENOUS | Status: DC

## 2021-02-10 MED ORDER — SENNOSIDES-DOCUSATE SODIUM 8.6-50 MG PO TABS
2.0000 | ORAL_TABLET | Freq: Every day | ORAL | Status: DC
  Administered 2021-02-12: 09:00:00 2 via ORAL
  Filled 2021-02-10 (×2): qty 2

## 2021-02-10 MED ORDER — LABETALOL HCL 5 MG/ML IV SOLN
20.0000 mg | INTRAVENOUS | Status: DC | PRN
  Administered 2021-02-10: 20 mg via INTRAVENOUS

## 2021-02-10 MED ORDER — OXYTOCIN BOLUS FROM INFUSION: 333.0000 mL | Freq: Once | INTRAVENOUS | Status: DC

## 2021-02-10 MED ORDER — IBUPROFEN 800 MG PO TABS
800.0000 mg | ORAL_TABLET | Freq: Four times a day (QID) | ORAL | Status: DC
  Administered 2021-02-10 – 2021-02-13 (×10): 800 mg via ORAL
  Filled 2021-02-10 (×10): qty 1

## 2021-02-10 NOTE — H&P (Addendum)
Obstetric History and Physical  Jodi Chapman is a 25 y.o. G1P0000 with IUP at [redacted]w[redacted]d with di/di twin pregnancy presenting for complaints of contractions off and on for 4 days, worsening since last night. Also reports SROM between 7:15-7:30 this morning. She denies vaginal bleeding,  with active fetal movement x 2.  She also complains of seeing floaters and worsening leg swelling today.   Prenatal Course Source of Care: [facility] with onset of care at 10 weeks Pregnancy complications or risks: Patient Active Problem List   Diagnosis Date Noted   Indication for care in labor or delivery 02/10/2021   Dichorionic diamniotic twin pregnancy in second trimester 09/28/2020   She plans to breastfeed She desires  undecided  for postpartum contraception.   Prenatal labs and studies: ABO, Rh: --/--/A POS (12/16 8099) Antibody: NEG (12/16 0917) Rubella: 1.61 (06/17 0915) RPR: Non Reactive (06/17 0915)  HBsAg: Negative (06/17 0915)  HIV: Non Reactive (06/17 0915)  GBS: unknown 1 hr Glucola  abnormal (142) with 3 hour testing normal Genetic screening normal Anatomy US normal   Past Medical History:  Diagnosis Date   Asthma    Immunization, viral disease    GARDASIL SERIES COMPLETE    Past Surgical History:  Procedure Laterality Date   WISDOM TOOTH EXTRACTION  03/29/2014    OB History  Gravida Para Term Preterm AB Living  1 0 0 0 0 0  SAB IAB Ectopic Multiple Live Births  0 0 0 0 0    # Outcome Date GA Lbr Len/2nd Weight Sex Delivery Anes PTL Lv  1 Current             Social History   Socioeconomic History   Marital status: Significant Other    Spouse name: Domenic Schwab   Number of children: 0   Years of education: 12   Highest education level: Not on file  Occupational History   Occupation: [employer]  Tobacco Use   Smoking status: Never   Smokeless tobacco: Never  Vaping Use   Vaping Use: Never used  Substance and Sexual Activity   Alcohol  use: No   Drug use: No   Sexual activity: Yes    Partners: Male    Birth control/protection: None    Comment: planning partner vasectomy  Other Topics Concern   Not on file  Social History Narrative   Not on file   Social Determinants of Health   Financial Resource Strain: Not on file  Food Insecurity: Not on file  Transportation Needs: Not on file  Physical Activity: Not on file  Stress: Not on file  Social Connections: Not on file    Family History  Problem Relation Age of Onset   Diabetes Mother    Hypertension Mother    Diabetes Maternal Grandmother    Hypertension Maternal Grandmother    Diabetes Maternal Grandfather    Hypertension Maternal Grandfather    Other Maternal Aunt        cx dysplasia   Ovarian cancer Maternal Aunt    Breast cancer Other        not sure of age, has contact    Medications Prior to Admission  Medication Sig Dispense Refill Last Dose   aspirin 81 MG chewable tablet Chew 81 mg by mouth daily.      Prenat-FeFum-FePo-FA-Omega 3 (CONCEPT DHA) 53.5-38-1 MG CAPS Take 1 capsule by mouth daily. 30 capsule 11     No Known Allergies  Review of  Systems: Negative except for what is mentioned in HPI.  Physical Exam: BP (!) 160/103 Comment (BP Location): MD made aware   Pulse 92    Temp (!) 97.4 F (36.3 C) (Oral)    Resp 18    Ht 5\' 7"  (1.702 m)    Wt 88.5 kg    LMP 05/28/2020    SpO2 99%    BMI 30.54 kg/m  CONSTITUTIONAL: Well-developed, well-nourished female in no acute distress.  HENT:  Normocephalic, atraumatic, External right and left ear normal. Oropharynx is clear and moist EYES: Conjunctivae and EOM are normal. Pupils are equal, round, and reactive to light. No scleral icterus.  NECK: Normal range of motion, supple, no masses SKIN: Skin is warm and dry. No rash noted. Not diaphoretic. No erythema. No pallor. NEUROLOGIC: Alert and oriented to person, place, and time. Normal reflexes, muscle tone coordination. No cranial nerve deficit  noted. PSYCHIATRIC: Normal mood and affect. Normal behavior. Normal judgment and thought content. CARDIOVASCULAR: Normal heart rate noted, regular rhythm RESPIRATORY: Effort and breath sounds normal, no problems with respiration noted ABDOMEN: Soft, nontender, nondistended, gravid. MUSCULOSKELETAL: Normal range of motion. +3 pitting edema bilaterally and no tenderness. 2+ distal pulses.  Cervical Exam: Dilatation 9.5 cm   Effacement 90%   Station 0 to +1  (rapidly changed from 5/80/0) Presentation: cephalic FHT:  Baseline rate 135 bpm Twin A.  Baseline rate 145 bpm Twin B.    Variability moderate  Accelerations present   Decelerations none Contractions: Every 2-3 mins   Pertinent Labs/Studies:   Results for orders placed or performed during the hospital encounter of 02/10/21 (from the past 24 hour(s))  Resp Panel by RT-PCR (Flu A&B, Covid) Nasopharyngeal Swab     Status: None   Collection Time: 02/10/21  9:15 AM   Specimen: Nasopharyngeal Swab; Nasopharyngeal(NP) swabs in vial transport medium  Result Value Ref Range   SARS Coronavirus 2 by RT PCR NEGATIVE NEGATIVE   Influenza A by PCR NEGATIVE NEGATIVE   Influenza B by PCR NEGATIVE NEGATIVE  Type and screen [place]     Status: None   Collection Time: 02/10/21  9:17 AM  Result Value Ref Range   ABO/RH(D) A POS    Antibody Screen NEG    Sample Expiration      02/13/2021,2359 Performed at [facility], [address] [place], [place] [address]   CBC     Status: Abnormal   Collection Time: 02/10/21  9:18 AM  Result Value Ref Range   WBC 10.7 (H) 4.0 - 10.5 K/uL   RBC 4.11 3.87 - 5.11 MIL/uL   Hemoglobin 11.7 (L) 12.0 - 15.0 g/dL   HCT 33.9 (L) 36.0 - 46.0 %   MCV 82.5 80.0 - 100.0 fL   MCH 28.5 26.0 - 34.0 pg   MCHC 34.5 30.0 - 36.0 g/dL   RDW 12.7 11.5 - 15.5 %   Platelets 272 150 - 400 K/uL   nRBC 0.0 0.0 - 0.2 %  Comprehensive metabolic panel     Status: Abnormal   Collection Time:  02/10/21  9:18 AM  Result Value Ref Range   Sodium 137 135 - 145 mmol/L   Potassium 3.7 3.5 - 5.1 mmol/L   Chloride 111 98 - 111 mmol/L   CO2 18 (L) 22 - 32 mmol/L   Glucose, Bld 80 70 - 99 mg/dL   BUN 9 6 - 20 mg/dL   Creatinine, Ser 1.26 (H) 0.44 - 1.00 mg/dL  Calcium 8.5 (L) 8.9 - 10.3 mg/dL   Total Protein 6.9 6.5 - 8.1 g/dL   Albumin 2.9 (L) 3.5 - 5.0 g/dL   AST 23 15 - 41 U/L   ALT 13 0 - 44 U/L   Alkaline Phosphatase 172 (H) 38 - 126 U/L   Total Bilirubin 0.8 0.3 - 1.2 mg/dL   GFR, Estimated >60 >60 mL/min   Anion gap 8 5 - 15  Urinalysis, Routine w reflex microscopic Urine, Clean Catch     Status: Abnormal   Collection Time: 02/10/21  9:18 AM  Result Value Ref Range   Color, Urine YELLOW YELLOW   APPearance CLEAR CLEAR   Specific Gravity, Urine 1.010 1.005 - 1.030   pH 5.5 5.0 - 8.0   Glucose, UA NEGATIVE NEGATIVE mg/dL   Hgb urine dipstick MODERATE (A) NEGATIVE   Bilirubin Urine NEGATIVE NEGATIVE   Ketones, ur NEGATIVE NEGATIVE mg/dL   Protein, ur NEGATIVE NEGATIVE mg/dL   Nitrite NEGATIVE NEGATIVE   Leukocytes,Ua NEGATIVE NEGATIVE  Urinalysis, Microscopic (reflex)     Status: Abnormal   Collection Time: 02/10/21  9:18 AM  Result Value Ref Range   RBC / HPF 6-10 0 - 5 RBC/hpf   WBC, UA 6-10 0 - 5 WBC/hpf   Bacteria, UA RARE (A) NONE SEEN   Squamous Epithelial / LPF NONE SEEN 0 - 5  Protein / creatinine ratio, urine     Status: None   Collection Time: 02/10/21  9:23 AM  Result Value Ref Range   Creatinine, Urine 129 mg/dL   Total Protein, Urine 12 mg/dL   Protein Creatinine Ratio 0.09 0.00 - 0.15 mg/mg[Cre]    Assessment : MEL MALCOM is a 25 y.o. G1P0000 at 100w5d being admitted for labor.  Likely GHTN diagnosed as patient with several elevated BPs, no proteinuria (however significante LE edema present). Pre-eclampsia labs negative. GBS unknown.   Plan: Labor: Expectant management. Anticipate vaginal delivery soon.  Analgesia as needed. Patient  desires epidural.  Pushed for ~ 45 minutes with only small amount of descent, to plus 2 station.  Patient's pushing efforts are moderate but intermittent due to intensity of pain.  Will allow to sit for epidural. Will treat severe range BPs with labetalol protocol.  FWB: Reassuring fetal heart tracing x 2.  GBS unknown.  Will start antibiotics as patient will sit for epidural.  Delivery plan: Hopeful for vaginal delivery   Rubie Maid, MD [facility]

## 2021-02-10 NOTE — Lactation Note (Signed)
This note was copied from a baby's chart. Lactation Consultation Note  Patient Name: Jodi Chapman [id number] Date: 02/10/2021 Reason for consult: L&D Initial assessment;Primapara;1st time breastfeeding;Late-preterm 34-36.6wks;Infant < 6lbs;Multiple gestation Age:24 hours Twins, mom currently pushing to deliver baby girl B   Maternal Data Does the patient have breastfeeding experience prior to this delivery?: No  Feeding Mother's Current Feeding Choice: Breast Milk Attempts made at latching baby to breast while mom is laboring, unable to sit mom up due to FHR decreasing, mom has everted nipple on left and baby rooting but baby unable to latch fully due flattening of breast/nipple and inability of baby to maintain a latch, attempted with 47mm nipple shield, baby can latch and suck occ but needs to rest frequently and tires easily, attempt ended and baby swaddled and given to dad while mom pushing twin B out   LATCH Score Latch: Repeated attempts needed to sustain latch, nipple held in mouth throughout feeding, stimulation needed to elicit sucking reflex.  Audible Swallowing: None  Type of Nipple: Everted at rest and after stimulation  Comfort (Breast/Nipple): Soft / non-tender  Hold (Positioning): Full assist, staff holds infant at breast  LATCH Score: 5   Lactation Tools Discussed/Used Tools: Nipple Shields Nipple shield size: 20  Interventions Interventions: Assisted with latch;Skin to skin;Hand express;Adjust position;Support pillows  Discharge    Consult Status Consult Status: Follow-up Date: 02/10/21 Follow-up type: In-patient    Dyann Kief 02/10/2021, 2:50 PM

## 2021-02-10 NOTE — Anesthesia Procedure Notes (Signed)
Epidural Patient location during procedure: OB Start time: 02/10/2021 10:30 AM End time: 02/10/2021 10:47 AM  Staffing Anesthesiologist: Foye Deer, MD Resident/CRNA: Ginger Carne, CRNA Performed: resident/CRNA   Preanesthetic Checklist Completed: patient identified, IV checked, site marked, risks and benefits discussed, surgical consent, monitors and equipment checked, pre-op evaluation and timeout performed  Epidural Patient position: sitting Prep: ChloraPrep Patient monitoring: continuous pulse ox and blood pressure Approach: midline Location: L3-L4 Injection technique: LOR saline  Needle:  Needle type: Tuohy  Needle gauge: 17 G Needle length: 9 cm and 9 Needle insertion depth: 7 cm Catheter type: closed end flexible Catheter size: 19 Gauge Catheter at skin depth: 12 cm Test dose: negative and 1.5% lidocaine with Epi 1:200 K  Assessment Sensory level: T10 Events: blood not aspirated, injection not painful, no injection resistance, no paresthesia and negative IV test  Additional Notes 1 attempt Pt. Evaluated and documentation done after procedure finished. Patient identified. Risks/Benefits/Options discussed with patient including but not limited to bleeding, infection, nerve damage, paralysis, failed block, incomplete pain control, headache, blood pressure changes, nausea, vomiting, reactions to medication both or allergic, itching and postpartum back pain. Confirmed with bedside nurse the patient's most recent platelet count. Confirmed with patient that they are not currently taking any anticoagulation, have any bleeding history or any family history of bleeding disorders. Patient expressed understanding and wished to proceed. All questions were answered. Sterile technique was used throughout the entire procedure. Please see nursing notes for vital signs.Combined spinal and epidural given. Spinal with positive csf then 21ml of 0.25% bupivicaine injected  with positive swirl. Test dose was given through epidural catheter and negative prior to continuing to dose epidural or start infusion. Warning signs of high block given to the patient including shortness of breath, tingling/numbness in hands, complete motor block, or any concerning symptoms with instructions to call for help. Patient was given instructions on fall risk and not to get out of bed. All questions and concerns addressed with instructions to call with any issues or inadequate analgesia.    Patient tolerated the insertion well without immediate complications.Reason for block:procedure for pain

## 2021-02-10 NOTE — Progress Notes (Signed)
Intrapartum Progress Note  S: Patient s/p delivery of Twin A.  Currently comfortable with epidural.   O: Blood pressure (!) 152/106, pulse (!) 101, temperature 99.1 F (37.3 C), temperature source Oral, resp. rate 18, height 5\' 7"  (1.702 m), weight 88.5 kg, last menstrual period 05/28/2020, SpO2 99 %. Gen App: NAD, comfortable Abdomen: soft, gravid FHT: baseline 135 bpm.  Accels present.  Decels present - variable deceleration, shallow x 1 . moderate in degree variability.   Tocometer: contractions q 2-3 minutes Cervix: 10/10/0/bulging bag Extremities: Nontender, +3 pitting edema.  Pitocin: None  Labs:  No new labs  Assessment:  1: SIUP at [redacted]w[redacted]d 2. Gestational HTN  Plan:  1. SROM'd, with clear fluid. Anticipate delivery of Twin B soon. Will allow to labor down. Overall reassuring tracing. 2. Continue Labetalol protocol for severe range BPs.    [redacted]w[redacted]d, MD 02/10/2021 5:17 PM

## 2021-02-10 NOTE — Anesthesia Preprocedure Evaluation (Addendum)
Anesthesia Evaluation  Patient identified by MRN, date of birth, ID band Patient awake    Reviewed: Allergy & Precautions, NPO status , Patient's Chart, lab work & pertinent test results  Airway Mallampati: II  TM Distance: >3 FB Neck ROM: full    Dental no notable dental hx.    Pulmonary asthma ,    Pulmonary exam normal        Cardiovascular Exercise Tolerance: Good negative cardio ROS Normal cardiovascular exam     Neuro/Psych negative neurological ROS  negative psych ROS   GI/Hepatic negative GI ROS, Neg liver ROS,   Endo/Other  negative endocrine ROS  Renal/GU negative Renal ROS  negative genitourinary   Musculoskeletal negative musculoskeletal ROS (+)   Abdominal   Peds negative pediatric ROS (+)  Hematology negative hematology ROS (+)   Anesthesia Other Findings Past Medical History: No date: Asthma No date: Immunization, viral disease     Comment:  GARDASIL SERIES COMPLETE  Past Surgical History: 03/29/2014: WISDOM TOOTH EXTRACTION  BMI    Body Mass Index: 30.54 kg/m      Reproductive/Obstetrics negative OB ROS                             Anesthesia Physical Anesthesia Plan  ASA: 2  Anesthesia Plan: Epidural   Post-op Pain Management:    Induction:   PONV Risk Score and Plan:   Airway Management Planned:   Additional Equipment:   Intra-op Plan:   Post-operative Plan:   Informed Consent: I have reviewed the patients History and Physical, chart, labs and discussed the procedure including the risks, benefits and alternatives for the proposed anesthesia with the patient or authorized representative who has indicated his/her understanding and acceptance.       Plan Discussed with: Anesthesiologist  Anesthesia Plan Comments:         Anesthesia Quick Evaluation

## 2021-02-10 NOTE — Lactation Note (Signed)
This note was copied from a baby's chart. Lactation Consultation Note  Patient Name: Jodi Chapman [id number] Date: 02/10/2021 Reason for consult: L&D Initial assessment;Primapara;1st time breastfeeding;Late-preterm 34-36.6wks Age:24 hours  Maternal Data Has patient been taught Hand Expression?: Yes Does the patient have breastfeeding experience prior to this delivery?: No  Feeding Mother's Current Feeding Choice: Breast Milk Unable to latch without shield, breast sl firm and swollen and late preterm not efficient at latching, latched well with shield and is nursing well with little stimulation, still nursing at 15 min on breast with occ. rest break  DEBP set up in room 340 on floor for mom to use as needed after transfer  LATCH Score Latch: Grasps breast easily, tongue down, lips flanged, rhythmical sucking. (with nipple shield)  Audible Swallowing: A few with stimulation  Type of Nipple: Everted at rest and after stimulation  Comfort (Breast/Nipple): Filling, red/small blisters or bruises, mild/mod discomfort (breast swollen and sl firm)  Hold (Positioning): Assistance needed to correctly position infant at breast and maintain latch.  LATCH Score: 7   Lactation Tools Discussed/Used Tools: Nipple Shields Nipple shield size: 20  Interventions Interventions: Breast feeding basics reviewed;Assisted with latch;Skin to skin;Hand express;Support pillows;Education  Discharge Pump: Personal WIC Program: Yes LC faxed referral to [place] WIC office Consult Status Consult Status: Follow-up from L&D    Dyann Kief 02/10/2021, 5:31 PM

## 2021-02-11 DIAGNOSIS — Z8759 Personal history of other complications of pregnancy, childbirth and the puerperium: Secondary | ICD-10-CM

## 2021-02-11 DIAGNOSIS — O139 Gestational [pregnancy-induced] hypertension without significant proteinuria, unspecified trimester: Secondary | ICD-10-CM | POA: Diagnosis present

## 2021-02-11 HISTORY — DX: Personal history of other complications of pregnancy, childbirth and the puerperium: Z87.59

## 2021-02-11 LAB — CBC
HCT: 23 % — ABNORMAL LOW (ref 36.0–46.0)
Hemoglobin: 7.9 g/dL — ABNORMAL LOW (ref 12.0–15.0)
MCH: 28.5 pg (ref 26.0–34.0)
MCHC: 34.3 g/dL (ref 30.0–36.0)
MCV: 83 fL (ref 80.0–100.0)
Platelets: 206 10*3/uL (ref 150–400)
RBC: 2.77 MIL/uL — ABNORMAL LOW (ref 3.87–5.11)
RDW: 12.8 % (ref 11.5–15.5)
WBC: 12.2 10*3/uL — ABNORMAL HIGH (ref 4.0–10.5)
nRBC: 0 % (ref 0.0–0.2)

## 2021-02-11 LAB — RPR: RPR Ser Ql: NONREACTIVE

## 2021-02-11 MED ORDER — AMLODIPINE BESYLATE 10 MG PO TABS
10.0000 mg | ORAL_TABLET | Freq: Every day | ORAL | Status: DC
  Filled 2021-02-11: qty 1

## 2021-02-11 MED ORDER — AMLODIPINE BESYLATE 10 MG PO TABS
10.0000 mg | ORAL_TABLET | Freq: Every day | ORAL | Status: DC
  Administered 2021-02-12 – 2021-02-13 (×2): 10 mg via ORAL
  Filled 2021-02-11 (×2): qty 1

## 2021-02-11 MED ORDER — AMLODIPINE BESYLATE 5 MG PO TABS
5.0000 mg | ORAL_TABLET | Freq: Once | ORAL | Status: AC
  Administered 2021-02-11: 5 mg via ORAL
  Filled 2021-02-11: qty 1

## 2021-02-11 NOTE — TOC Transition Note (Signed)
Transition of Care [facility]) - CM/SW Discharge Note   Patient Details  Name: Jodi Chapman MRN: [id number] Date of Birth: August 07, 1995  Transition of Care [facility]) CM/SW Contact:  Susa Simmonds, LCSWA Phone Number: 02/11/2021, 11:35 AM   Clinical Narrative:    CSW spoke with patient about her positive marijuana screen. Patient stated that she had already discussed this with her nurse. Patients nurse stated that patient ate some gummies during her pregnancy. CSW explained that if the babies urine or cord come back positive for marijuana that a CPS report will be made. Patient stated she understood and did not have any questions for CSW. There were no further concerns.          Patient Goals and CMS Choice        Discharge Placement                       Discharge Plan and Services                                     Social Determinants of Health (SDOH) Interventions     Readmission Risk Interventions No flowsheet data found.

## 2021-02-11 NOTE — Progress Notes (Signed)
Post Partum Day # 1, s/p SVD, di-di twin preterm delivery at 35.5 weeks.  Gestational HTN .  Subjective: no complaints, up ad lib, voiding, and tolerating PO  Objective: Temp:  [97.5 F (36.4 C)-102 F (38.9 C)] 97.5 F (36.4 C) (12/17 1136) Pulse Rate:  [72-125] 80 (12/17 1136) Resp:  [18-20] 18 (12/17 1136) BP: (123-165)/(74-133) 146/92 (12/17 1136) SpO2:  [95 %-99 %] 99 % (12/17 1136)  Physical Exam:  General: alert and no distress  Lungs: clear to auscultation bilaterally Breasts: normal appearance, no masses or tenderness Heart: regular rate and rhythm, S1, S2 normal, no murmur, click, rub or gallop Abdomen: soft, non-tender; bowel sounds normal; no masses,  no organomegaly Pelvis: Lochia: appropriate, Uterine Fundus: firm Extremities: DVT Evaluation: No evidence of DVT seen on physical exam. Negative Homan's sign. No cords or calf tenderness. Calf/Ankle edema is present up to thighs, 2+ pitting edema.  Recent Labs    02/10/21 0918 02/11/21 0658  HGB 11.7* 7.9*  HCT 33.9* 23.0*    Assessment/Plan: Overall doing well.  Breastfeeding, Lactation consult Contraception undecided. Continue Norvasc for GHTN.  Will increase dose from 5 to 10 mg.  Continue Lasix for management of significant edema. Postpartum anemia (iron deficiency) secondary to blood loss. Overall stable. Advise on PO iron supplementation Dispo: likely d/c home tomorrow.     LOS: 1 day   Hildred Laser, MD [facility] Care 02/11/2021 12:25 PM

## 2021-02-12 LAB — COMPREHENSIVE METABOLIC PANEL
ALT: 18 U/L (ref 0–44)
AST: 28 U/L (ref 15–41)
Albumin: 2.6 g/dL — ABNORMAL LOW (ref 3.5–5.0)
Alkaline Phosphatase: 130 U/L — ABNORMAL HIGH (ref 38–126)
Anion gap: 9 (ref 5–15)
BUN: 8 mg/dL (ref 6–20)
CO2: 24 mmol/L (ref 22–32)
Calcium: 8.5 mg/dL — ABNORMAL LOW (ref 8.9–10.3)
Chloride: 106 mmol/L (ref 98–111)
Creatinine, Ser: 0.9 mg/dL (ref 0.44–1.00)
GFR, Estimated: >60 mL/min (ref >60)
Glucose, Bld: 79 mg/dL (ref 70–99)
Potassium: 3.7 mmol/L (ref 3.5–5.1)
Sodium: 139 mmol/L (ref 135–145)
Total Bilirubin: 0.4 mg/dL (ref 0.3–1.2)
Total Protein: 6.2 g/dL — ABNORMAL LOW (ref 6.5–8.1)

## 2021-02-12 LAB — CBC
HCT: 27.1 % — ABNORMAL LOW (ref 36.0–46.0)
Hemoglobin: 9.3 g/dL — ABNORMAL LOW (ref 12.0–15.0)
MCH: 29 pg (ref 26.0–34.0)
MCHC: 34.3 g/dL (ref 30.0–36.0)
MCV: 84.4 fL (ref 80.0–100.0)
Platelets: 300 10*3/uL (ref 150–400)
RBC: 3.21 MIL/uL — ABNORMAL LOW (ref 3.87–5.11)
RDW: 13.2 % (ref 11.5–15.5)
WBC: 12.4 10*3/uL — ABNORMAL HIGH (ref 4.0–10.5)
nRBC: 0 % (ref 0.0–0.2)

## 2021-02-12 LAB — PROTEIN / CREATININE RATIO, URINE
Creatinine, Urine: 27 mg/dL
Total Protein, Urine: <6 mg/dL

## 2021-02-12 MED ORDER — HYDROCHLOROTHIAZIDE 25 MG PO TABS
25.0000 mg | ORAL_TABLET | Freq: Every day | ORAL | Status: DC
  Administered 2021-02-12 – 2021-02-13 (×2): 25 mg via ORAL
  Filled 2021-02-12 (×2): qty 1

## 2021-02-12 NOTE — TOC Progression Note (Signed)
Transition of Care [facility]) - Initial/Assessment Note    Patient Details  Name: Jodi Chapman MRN: [id number] Date of Birth: 01-09-1996  Transition of Care [facility]) CM/SW Contact:    Marina Goodell Phone Number: [phone] 02/12/2021, 12:37 PM  Clinical Narrative:                  CSW contacted [facility] CPS to make report.  Left message with operator w/ CSW contact information, as well as Dagoberto Reef [facility] contact information in case they are not abel to return call today 02/12/2021.Marland Kitchen       Patient Goals and CMS Choice        Expected Discharge Plan and Services                                                Prior Living Arrangements/Services                       Activities of Daily Living Home Assistive Devices/Equipment: None ADL Screening (condition at time of admission) Patient's cognitive ability adequate to safely complete daily activities?: Yes Is the patient deaf or have difficulty hearing?: No Does the patient have difficulty seeing, even when wearing glasses/contacts?: No Does the patient have difficulty concentrating, remembering, or making decisions?: Yes Patient able to express need for assistance with ADLs?: Yes Does the patient have difficulty dressing or bathing?: No Independently performs ADLs?: Yes (appropriate for developmental age) Does the patient have difficulty walking or climbing stairs?: No Weakness of Legs: None Weakness of Arms/Hands: None  Permission Sought/Granted                  Emotional Assessment              Admission diagnosis:  Indication for care in labor or delivery [O75.9] Patient Active Problem List   Diagnosis Date Noted   Gestational hypertension 02/11/2021   Indication for care in labor or delivery 02/10/2021   Dichorionic diamniotic twin pregnancy in second trimester 09/28/2020   PCP:  Hildred Laser, MD Pharmacy:   [facility] [address] [place] - [address] [place] [address] Phone: [phone] Fax: [phone]     Social Determinants of Health (SDOH) Interventions    Readmission Risk Interventions No flowsheet data found.

## 2021-02-12 NOTE — Progress Notes (Signed)
1937 B/P noted to be 157/97. I rechecked B/P at this time and B/P is 148/88. Pt. Denies H/A, Spots before eyes and/or epigastric pain. Reflexes are 2 Plus. She has 3 Plus pitting edema below the Knee and ankles/feet.

## 2021-02-12 NOTE — Progress Notes (Signed)
Post Partum Day # 2, s/p SVD, di-di twin preterm delivery at 35.5 weeks.  Gestational HTN .  Subjective: up ad lib, voiding, tolerating PO, and reports headache, mostly frontal. Denies SOB, chest pain, RUQ pain or vision changes.   Ambulating without difficulty, tolerating PO.  Objective: Vitals:   02/12/21 0005 02/12/21 0359 02/12/21 0824 02/12/21 1228  BP: 127/75 134/86 (!) 143/98 (!) 148/90  Pulse: 68 68 85 82  Resp: 17 18 16    Temp: 98.3 F (36.8 C) 98 F (36.7 C) 97.9 F (36.6 C)   TempSrc: Oral Oral Oral   SpO2: 99%  99%   Weight:      Height:         Physical Exam:  General: alert and no distress  Lungs: clear to auscultation bilaterally Breasts: normal appearance, no masses or tenderness Heart: regular rate and rhythm, S1, S2 normal, no murmur, click, rub or gallop Abdomen: soft, non-tender; bowel sounds normal; no masses,  no organomegaly Pelvis: Lochia: appropriate, Uterine Fundus: firm Extremities: DVT Evaluation: No evidence of DVT seen on physical exam. Negative Homan's sign. No cords or calf tenderness. Calf/Ankle edema is present up to thighs, 2+ pitting edema.  Recent Labs    02/10/21 0918 02/11/21 0658  HGB 11.7* 7.9*  HCT 33.9* 23.0*     Assessment/Plan: Breastfeeding, s/p Lactation consult. Contraception undecided. Continue Norvasc for GHTN. Currently on 10 mg.  Also will add HCTZ. Will repeat PIH labs due to new onset headache. Continue Lasix daily for management of significant edema. Postpartum anemia (iron deficiency) secondary to blood loss. Overall stable. Continue on PO iron supplementation Dispo: likely d/c home tomorrow if BPs are better controlled.      LOS: 2 days   02/13/21, MD [facility] Care 02/12/2021 12:36 PM

## 2021-02-12 NOTE — TOC Progression Note (Signed)
Transition of Care [facility]) - Progression Note    Patient Details  Name: Jodi Chapman MRN: [id number] Date of Birth: 09/16/1995  Transition of Care [facility]) CM/SW Contact  Marina Goodell Phone Number: [phone] 02/12/2021, 5:10 PM  Clinical Narrative:     CSW spoke with patient.  Patient was in room with parents and significant other, w/ both babies.  CSW updated patient on call to CPS for positive drug screen for baby.  Patient verbalized understanding.   CSW asked patient about resources and support systems.  Patient stated she had everything in place and has a lot of support from family and babies' father.  CSW gave patient resource list.  CSW updated Unit RN.       Expected Discharge Plan and Services                                                 Social Determinants of Health (SDOH) Interventions    Readmission Risk Interventions No flowsheet data found.

## 2021-02-13 ENCOUNTER — Other Ambulatory Visit: Payer: Self-pay

## 2021-02-13 MED ORDER — HYDROCHLOROTHIAZIDE 25 MG PO TABS
25.0000 mg | ORAL_TABLET | Freq: Every day | ORAL | 0 refills | Status: DC
  Filled 2021-02-13: qty 60, 60d supply, fill #0

## 2021-02-13 MED ORDER — AMLODIPINE BESYLATE 10 MG PO TABS
10.0000 mg | ORAL_TABLET | Freq: Every day | ORAL | 0 refills | Status: DC
Start: 2021-02-13 — End: 2021-04-13

## 2021-02-13 MED ORDER — AMLODIPINE BESYLATE 10 MG PO TABS
10.0000 mg | ORAL_TABLET | Freq: Every day | ORAL | 0 refills | Status: DC
  Filled 2021-02-13: qty 60, 60d supply, fill #0

## 2021-02-13 MED ORDER — IBUPROFEN 600 MG PO TABS: 600.0000 mg | ORAL_TABLET | Freq: Four times a day (QID) | ORAL | 1 refills | Status: DC | PRN

## 2021-02-13 MED ORDER — IBUPROFEN 600 MG PO TABS
600.0000 mg | ORAL_TABLET | Freq: Four times a day (QID) | ORAL | 1 refills | Status: DC | PRN
  Filled 2021-02-13: qty 60, 15d supply, fill #0

## 2021-02-13 MED ORDER — HYDROCHLOROTHIAZIDE 25 MG PO TABS: 25.0000 mg | ORAL_TABLET | Freq: Every day | ORAL | 0 refills | Status: DC

## 2021-02-13 MED ORDER — FERROUS SULFATE 325 (65 FE) MG PO TABS
325.0000 mg | ORAL_TABLET | Freq: Two times a day (BID) | ORAL | 1 refills | Status: DC
  Filled 2021-02-13: qty 60, 30d supply, fill #0

## 2021-02-13 MED ORDER — DOCUSATE SODIUM 100 MG PO CAPS
100.0000 mg | ORAL_CAPSULE | Freq: Two times a day (BID) | ORAL | 2 refills | Status: DC | PRN
  Filled 2021-02-13: qty 30, 15d supply, fill #0

## 2021-02-13 MED ORDER — FUROSEMIDE 20 MG PO TABS
20.0000 mg | ORAL_TABLET | Freq: Every day | ORAL | 0 refills | Status: DC
Start: 2021-02-13 — End: 2021-07-13

## 2021-02-13 MED ORDER — FUROSEMIDE 20 MG PO TABS
20.0000 mg | ORAL_TABLET | Freq: Every day | ORAL | 0 refills | Status: DC
  Filled 2021-02-13: qty 5, 5d supply, fill #0

## 2021-02-13 NOTE — Progress Notes (Signed)
Patient discharged with infant. Discharge instructions, prescriptions, and follow up appointments given to and reviewed with patient. Patient verbalized understanding. Will be escorted out by axillary.  °

## 2021-02-13 NOTE — Lactation Note (Signed)
This note was copied from a baby's chart. Lactation Consultation Note  Patient Name: Jodi Chapman HERDE'Y Date: 02/13/2021 Reason for consult: Initial assessment;Primapara;Late-preterm 34-36.6wks;Multiple gestation Age:24 hours  Lactation met with first time parents of twins prior to discharge. 48 born SVD 65 hrs ago at 54w5dand have remained in room with parents. Mom has started pumping with output of up to 710mper session, breastmilk being fortified with human milk fortifier, and guidance given for increasing breastmilk to 22cal/feeding with neosure powder for home. Mom has goals to eventually get twins to the breast for feedings and minimize pumping. We discussed limited feeding times/energy, the formula provides additional vitamins/minerals missed during the last 5 weeks of gestation. Parents have been taught paced-bottle feeding and are feeling confident with discharge and feedings. [place]CP[place]iew[place] with mom anticipated breast changes and management of breast fullness and engorgement through pumping, along with nipple care.  Tips given for transitioning twins to the breast for feedings, starting slow and gradually increasing frequency and exposure. Information for outpatient lactation services given, parents encouraged to call with questions and for ongoing BF support as needed.  Maternal Data Has patient been taught Hand Expression?: Yes Does the patient have breastfeeding experience prior to this delivery?: No  Feeding Mother's Current Feeding Choice: Breast Milk Nipple Type: Dr. BrClement HusbandsLASt [facility] Cent[facility]ore                    Lactation Tools Discussed/Used Tools: Pump Breast pump type: Double-Electric Breast Pump Reason for Pumping: 35wk  Interventions Interventions: Breast feeding basics reviewed;Education;Pace feeding;DEBP  Discharge Discharge Education: Engorgement and breast care;Outpatient recommendation Pump: Personal (hands free)  Consult  Status Consult Status: Complete    SaLavonia Drafts2/19/2022, 9:33 AM

## 2021-02-13 NOTE — Discharge Summary (Signed)
Postpartum Discharge Summary     Patient Name: Jodi Chapman DOB: 1995-05-18 MRN: [id number]  Date of admission: 02/10/2021 Delivery date:   Davonne, Baby [[id number]]  02/10/2021    Chandra, Asher [[id number]]  02/10/2021  Delivering provider:    Orianna, Biskup [[id number]]  Ellise, Kovack [place] [[id number]]  Rubie Maid  Date of discharge: 02/13/2021  Admitting diagnosis: Indication for care in labor or delivery [O75.9] Intrauterine pregnancy: [redacted]w[redacted]d    Secondary diagnosis:  Principal Problem:   Indication for care in labor or delivery Active Problems:   Dichorionic diamniotic twin pregnancy in second trimester   Gestational hypertension  Additional problems: Lower extremity edema    Discharge diagnosis: Preterm Pregnancy Delivered, Gestational Hypertension, and lower extremity edema                                               Post partum procedures: None Augmentation: AROM Complications: None  Hospital course: Onset of Labor With Vaginal Delivery      25y.o. yo [id number] 363w5das admitted in Active Labor on 02/10/2021. Patient had an uncomplicated labor course as follows:  Membrane Rupture Time/Date:    McMaysun, Meditz0[[id number]]7:30 AM    [facility]Roxy [facility]0[[id number]]1:10 PM ,   McShauni, Hen[facility][[id number]]02/10/2021    McMaigan, Bittinger0[[id number]]02/10/2021   Delivery Method:   McJohna Sheriff0[[id number]]Vaginal, Spontaneous    McValora, [facility]0[[id number]]Vaginal, Spontaneous  Episiotomy:    McAleesa, Sweigert0[[id number]]None    McCabrina, ShiromaaBrent0[[id number]]None  Lacerations:     McDiala, Waxman0[[id number]]None    McAlmarie, Kurdziel0[[id number]]None  Patient had an uncomplicated postpartum course.  She is ambulating, tolerating a regular diet, passing flatus, and urinating well. Patient is  discharged home in stable condition on 02/15/21.  Newborn Data: Birth date:   McKaisyn, Reinhold0[[id number]]02/10/2021    McDelaila, Nand0[[id number]]02/10/2021  Birth time:   McNadiyah, Zeis0[[id number]]1:05 PM    McEleni, FrankaFort Deposit0[[id number]]4:14 PM  Gender:   McKaralee, Hauter0[[id number]]Female    McChasiti, [place]0[[id number]]Female  Living status:   McAfton, Lavalle0[[id number]]Living    McMarshea, WisheraSouth Komelik0[[id number]]Living  Apgars:   McMirabella, Hilario0[[id number]]8 [address][[id number]]8 ,   McKristle, WeschaWhite Stone0[[id number]]9    McLeana, SpringstonaKittanning0[[id number]]9  Weight:   McTimira, Bieda0[[id number]]5 lb 2.2 oz (2.33 kg)    McLilyauna, Miedema0[[id number]]5 lb 0.8 oz (2.29 kg)   Magnesium Sulfate received: No BMZ received: No Rhophylac:No MMR:No T-DaP:Given prenatally Transfusion:No  Physical exam  Vitals:   02/12/21 1937 02/12/21 2107 02/12/21 2341 02/13/21 0256  BP: (!) 157/97 (!) 148/88 137/79 (!) 145/92  Pulse: 71 84 78 78  Resp: 18 18 18 18   Temp: 98.4 F (36.9 C)  98.3 F (36.8 C) 98.3 F (36.8 C)  TempSrc: Oral  Oral Oral  SpO2: 100%  98% 100%  Weight:      Height:  General: alert, cooperative, and no distress Lochia: appropriate Uterine Fundus: firm Incision: N/A DVT Evaluation: No evidence of DVT seen on physical exam. Negative Homan's sign. No cords or calf tenderness. Calf/Ankle edema is present (+2 pitting edema bilaterally to knees)   Labs: Lab Results  Component Value Date   WBC 12.4 (H) 02/12/2021   HGB 9.3 (L) 02/12/2021   HCT 27.1 (L) 02/12/2021   MCV 84.4 02/12/2021   PLT 300 02/12/2021    CMP Latest Ref Rng & Units 02/12/2021  Glucose 70 - 99 mg/dL 79  BUN 6 - 20 mg/dL 8  Creatinine 0.44 - 1.00 mg/dL 0.90  Sodium 135 - 145 mmol/L 139  Potassium 3.5 - 5.1 mmol/L 3.7  Chloride 98 -  111 mmol/L 106  CO2 22 - 32 mmol/L 24  Calcium 8.9 - 10.3 mg/dL 8.5(L)  Total Protein 6.5 - 8.1 g/dL 6.2(L)  Total Bilirubin 0.3 - 1.2 mg/dL 0.4  Alkaline Phos 38 - 126 U/L 130(H)  AST 15 - 41 U/L 28  ALT 0 - 44 U/L 18   Edinburgh Score: Edinburgh Postnatal Depression Scale Screening Tool 02/12/2021  I have been able to laugh and see the funny side of things. 0  I have looked forward with enjoyment to things. 1  I have blamed myself unnecessarily when things went wrong. 0  I have been anxious or worried for no good reason. 1  I have felt scared or panicky for no good reason. 1  Things have been getting on top of me. 1  I have been so unhappy that I have had difficulty sleeping. 0  I have felt sad or miserable. 1  I have been so unhappy that I have been crying. 1  The thought of harming myself has occurred to me. 0  Edinburgh Postnatal Depression Scale Total 6     After visit meds:  Allergies as of 02/13/2021   No Known Allergies      Medication List     STOP taking these medications    aspirin 81 MG chewable tablet       TAKE these medications    amLODipine 10 MG tablet Commonly known as: NORVASC Take 1 tablet (10 mg total) by mouth daily.   Concept DHA 53.5-38-1 MG Caps Take 1 capsule by mouth daily.   docusate sodium 100 MG capsule Commonly known as: COLACE Take 1 capsule (100 mg total) by mouth 2 (two) times daily as needed.   ferrous sulfate 325 (65 FE) MG tablet Commonly known as: FerrouSul Take 1 tablet (325 mg total) by mouth 2 (two) times daily.   furosemide 20 MG tablet Commonly known as: LASIX Take 1 tablet (20 mg total) by mouth daily.   hydrochlorothiazide 25 MG tablet Commonly known as: HYDRODIURIL Take 1 tablet (25 mg total) by mouth daily.   ibuprofen 600 MG tablet Commonly known as: ADVIL Take 1 tablet (600 mg total) by mouth every 6 (six) hours as needed.         Discharge home in stable condition Infant Feeding:  Breast Infant Disposition:home with mother Discharge instruction: per After Visit Summary and Postpartum booklet. Activity: Advance as tolerated. Pelvic rest for 6 weeks.  Diet: routine diet Anticipated Birth Control: Unsure Postpartum Appointment: 3-5 days for BP check , 6 week postpartum appointment Additional Postpartum F/U: Postpartum Depression checkup Future Appointments:No future appointments. Follow up Visit:  Follow-up Information     Rubie Maid, MD Follow up.   Specialties: Obstetrics and Gynecology, Radiology  Why: 3-4 days for BP check 2 weeks televisit mood check 6 weeks postpartum visit Contact information: [place] [address] [phone]                     02/13/2021 Rubie Maid, MD

## 2021-02-13 NOTE — Anesthesia Postprocedure Evaluation (Signed)
Anesthesia Post Note  Patient: Jodi Chapman  Procedure(s) Performed: AN AD HOC LABOR EPIDURAL  Patient location during evaluation: Mother Baby Anesthesia Type: Epidural Level of consciousness: awake and alert Pain management: pain level controlled Vital Signs Assessment: post-procedure vital signs reviewed and stable Respiratory status: spontaneous breathing, nonlabored ventilation and respiratory function stable Cardiovascular status: stable Postop Assessment: no headache, no backache and epidural receding Anesthetic complications: no   No notable events documented.   Last Vitals:  Vitals:   02/12/21 2341 02/13/21 0256  BP: 137/79 (!) 145/92  Pulse: 78 78  Resp: 18 18  Temp: 36.8 C 36.8 C  SpO2: 98% 100%    Last Pain:  Vitals:   02/13/21 0555  TempSrc:   PainSc: 3                  Amiliah Campisi

## 2021-02-13 NOTE — Progress Notes (Signed)
Post Partum Day # 3, s/p SVD, di-di twin preterm delivery at 35.5 weeks.  Gestational HTN .  Subjective: up ad lib, voiding, tolerating PO, and reports headache, mostly frontal. Denies SOB, chest pain, RUQ pain or vision changes.   Ambulating without difficulty, tolerating PO.  Objective: Vitals:   02/12/21 1937 02/12/21 2107 02/12/21 2341 02/13/21 0256  BP: (!) 157/97 (!) 148/88 137/79 (!) 145/92  Pulse: 71 84 78 78  Resp: 18 18 18 18   Temp: 98.4 F (36.9 C)  98.3 F (36.8 C) 98.3 F (36.8 C)  TempSrc: Oral  Oral Oral  SpO2: 100%  98% 100%  Weight:      Height:         Physical Exam:  General: alert and no distress  Lungs: clear to auscultation bilaterally Breasts: normal appearance, no masses or tenderness Heart: regular rate and rhythm, S1, S2 normal, no murmur, click, rub or gallop Abdomen: soft, non-tender; bowel sounds normal; no masses,  no organomegaly Pelvis: Lochia: appropriate, Uterine Fundus: firm Extremities: DVT Evaluation: No evidence of DVT seen on physical exam. Negative Homan's sign. No cords or calf tenderness. Calf/Ankle edema is present up to thighs, 2+ pitting edema.  Recent Labs    02/11/21 0658 02/12/21 1302  HGB 7.9* 9.3*  HCT 23.0* 27.1*     Assessment/Plan: Breastfeeding, s/p Lactation consult. Contraception undecided. Continue Norvasc for GHTN. Currently on 10 mg.  Started on HCTZ 25 mg yesterday. Repeat PIH labs negative. Continue Lasix daily for management of significant edema. Postpartum anemia (iron deficiency) secondary to blood loss. Overall stable. Continue on PO iron supplementation Dispo: will likely d/c home later today.     LOS: 3 days   02/14/21, MD [facility] Care 02/13/2021 7:54 AM

## 2021-02-14 ENCOUNTER — Other Ambulatory Visit: Payer: Medicaid Other

## 2021-02-14 ENCOUNTER — Ambulatory Visit: Payer: Medicaid Other

## 2021-02-16 ENCOUNTER — Other Ambulatory Visit: Payer: Self-pay

## 2021-02-16 ENCOUNTER — Ambulatory Visit (INDEPENDENT_AMBULATORY_CARE_PROVIDER_SITE_OTHER): Payer: Medicaid Other | Admitting: Obstetrics and Gynecology

## 2021-02-16 VITALS — BP 123/87 | HR 81

## 2021-02-16 DIAGNOSIS — O135 Gestational [pregnancy-induced] hypertension without significant proteinuria, complicating the puerperium: Secondary | ICD-10-CM | POA: Diagnosis not present

## 2021-02-16 DIAGNOSIS — Z0130 Encounter for examination of blood pressure without abnormal findings: Secondary | ICD-10-CM

## 2021-02-16 NOTE — Progress Notes (Signed)
Patient is here for a blood pressure check. Patient denies chest pain, palpitations, shortness of breath or dizziness. She continues to have some swelling in her feet (1+ pitting), her floaters have resolved. At previous visit blood pressure was 137/85. Today during nurse visit first check blood pressure was 123/87 with a heart rate of 87. She does take blood pressure medications, she just started them yesterday.

## 2021-02-22 ENCOUNTER — Ambulatory Visit: Payer: Medicaid Other

## 2021-02-22 ENCOUNTER — Other Ambulatory Visit: Payer: Medicaid Other

## 2021-02-22 ENCOUNTER — Telehealth: Payer: Self-pay | Admitting: Obstetrics and Gynecology

## 2021-02-22 NOTE — Telephone Encounter (Signed)
After discussing with Dr. Valentino Saxon, patient was advised to discontinue the hydrochlorothiazide medication. Patient was left a voicemail with instructions as well as the call back number for the office

## 2021-02-22 NOTE — Telephone Encounter (Signed)
Ok, then she must have had a vagal response. No further concerns at this time.

## 2021-02-22 NOTE — Telephone Encounter (Signed)
Pt called in stating that she passed out today while visiting her aunt in the hospital and she stated that she gave birth to twins about two weeks ago.  Pt stated that she while visiting they checked her vitals and stated that everything looked okay.  Pt is very concerned and thought that should call to let Dr. Valentino Saxon know about.  Call was given to [facility] to assist.

## 2021-02-22 NOTE — Telephone Encounter (Signed)
Patient returning phone call and stated that she has not been taking either of her blood pressure medication after her last appointment. Patient reports feeling well and getting enough sleep.   Please advise

## 2021-02-23 ENCOUNTER — Telehealth: Payer: Self-pay | Admitting: Obstetrics and Gynecology

## 2021-02-23 NOTE — Telephone Encounter (Signed)
Pt called asking to speak with taylor, asked for a csll back- pt would not go into detail just that she had been speaking with taylor about passing out. Please ADvsie

## 2021-02-23 NOTE — Telephone Encounter (Signed)
Called patient with recommendation and she states she will follow up with an urgent care this evening or tomorrow.

## 2021-02-23 NOTE — Telephone Encounter (Signed)
Please see prior note

## 2021-02-23 NOTE — Telephone Encounter (Signed)
Patient calling back stating she has been sweating throughout the night, headache from 8 pm 12/28 to present with 600 mg ibuprofen not helping at 3 am, body aches and not feeling herself.  Patient states she has not been around anyone sick as well.  Please advise

## 2021-02-23 NOTE — Telephone Encounter (Signed)
She may need to be tested for the flu if she is having body aches. She can go to an urgent care or [facility] walk-in clinic (before 4:30 pm)

## 2021-03-27 NOTE — Progress Notes (Signed)
° °  OBSTETRICS POSTPARTUM CLINIC PROGRESS NOTE  Subjective:     Jodi Chapman is a 26 y.o. [id number] female who presents for a postpartum visit. She is 6 week postpartum following a spontaneous vaginal delivery. I have fully reviewed the prenatal and intrapartum course. The delivery was at 35 gestational weeks, pregnancy was complicated by twin gestation with gestational HTN, GDM, preterm labor.  Anesthesia: epidural. Postpartum course has been well. Babies' courses have been well. Baby is feeding by breast and bottle - Similac Neosure. Bleeding: patient has not not resumed menses, with No LMP recorded. Bowel function is normal. Bladder function is normal. Patient is not sexually active. Contraception method desired is vasectomy. Postpartum depression screening: negative.  EDPS score is 0.    The following portions of the patient's history were reviewed and updated as appropriate: allergies, current medications, past family history, past medical history, past social history, past surgical history, and problem list.  Review of Systems Pertinent items noted in HPI and remainder of comprehensive ROS otherwise negative.   Objective:    BP 117/68    Pulse 94    Ht 5\' 7"  (1.702 m)    Wt 152 lb (68.9 kg)    SpO2 96%    Breastfeeding Yes Comment: formula--Similac   BMI 23.81 kg/m    General:  alert and no distress   Breasts:  inspection negative, no nipple discharge or bleeding, no masses or nodularity palpable  Lungs: clear to auscultation bilaterally  Heart:  regular rate and rhythm, S1, S2 normal, no murmur, click, rub or gallop  Abdomen: soft, non-tender; bowel sounds normal; no masses,  no organomegaly.     Vulva:  normal  Vagina: normal vagina, no discharge, exudate, lesion, or erythema  Cervix:  no cervical motion tenderness and no lesions  Corpus: normal size, contour, position, consistency, mobility, non-tender  Adnexa:  normal adnexa and no mass, fullness, tenderness  Rectal Exam: Not  performed.         Labs:  Lab Results  Component Value Date   HGB 9.3 (L) 02/12/2021     Assessment:   1. Postpartum care following vaginal delivery   2. Postpartum anemia   3. History of gestational hypertension      Plan:    1. Contraception: vasectomy, but will use Phexxi during interim.  2. Will check Hgb for h/o postpartum anemia of less than 10.  3. Follow up in:  4-6  months or as needed.   02/14/2021, MD [facility]

## 2021-03-28 ENCOUNTER — Ambulatory Visit (INDEPENDENT_AMBULATORY_CARE_PROVIDER_SITE_OTHER): Payer: Medicaid Other | Admitting: Obstetrics and Gynecology

## 2021-03-28 ENCOUNTER — Encounter: Payer: Self-pay | Admitting: Obstetrics and Gynecology

## 2021-03-28 ENCOUNTER — Other Ambulatory Visit: Payer: Self-pay

## 2021-03-28 DIAGNOSIS — Z8759 Personal history of other complications of pregnancy, childbirth and the puerperium: Secondary | ICD-10-CM

## 2021-03-28 DIAGNOSIS — O9081 Anemia of the puerperium: Secondary | ICD-10-CM | POA: Diagnosis not present

## 2021-03-29 LAB — HEMOGLOBIN AND HEMATOCRIT, BLOOD
Hematocrit: 35.5 % (ref 34.0–46.6)
Hemoglobin: 11.3 g/dL (ref 11.1–15.9)

## 2021-04-08 ENCOUNTER — Other Ambulatory Visit: Payer: Self-pay | Admitting: Obstetrics and Gynecology

## 2021-04-13 ENCOUNTER — Other Ambulatory Visit: Payer: Self-pay | Admitting: Obstetrics and Gynecology

## 2021-07-12 NOTE — Progress Notes (Deleted)
    GYNECOLOGY PROGRESS NOTE  Subjective:    Patient ID: Jodi Chapman, female    DOB: 1995/10/16, 26 y.o.   MRN: [id number]  HPI  Patient is a 26 y.o. G83P0102 female who presents for postpartum depression.  {Common ambulatory SmartLinks:19316}  Review of Systems {ros; complete:30496}   Objective:   currently breastfeeding. There is no height or weight on file to calculate BMI. General appearance: {general exam:16600} Abdomen: {abdominal exam:16834} Pelvic: {pelvic exam:16852::"cervix normal in appearance","external genitalia normal","no adnexal masses or tenderness","no cervical motion tenderness","rectovaginal septum normal","uterus normal size, shape, and consistency","vagina normal without discharge"} Extremities: {extremity exam:5109} Neurologic: {neuro exam:17854}   Assessment:   No diagnosis found.   Plan:   There are no diagnoses linked to this encounter.   Hildred Laser, MD [facility]

## 2021-07-13 ENCOUNTER — Encounter: Payer: Self-pay | Admitting: Obstetrics and Gynecology

## 2021-07-13 ENCOUNTER — Encounter: Payer: Medicaid Other | Admitting: Obstetrics and Gynecology

## 2021-07-13 ENCOUNTER — Ambulatory Visit (INDEPENDENT_AMBULATORY_CARE_PROVIDER_SITE_OTHER): Payer: Medicaid Other | Admitting: Obstetrics and Gynecology

## 2021-07-13 VITALS — BP 119/67 | HR 66 | Resp 16 | Ht 67.0 in | Wt 146.8 lb

## 2021-07-13 DIAGNOSIS — F53 Postpartum depression: Secondary | ICD-10-CM

## 2021-07-13 DIAGNOSIS — N912 Amenorrhea, unspecified: Secondary | ICD-10-CM | POA: Diagnosis not present

## 2021-07-13 MED ORDER — SERTRALINE HCL 50 MG PO TABS: 50.0000 mg | ORAL_TABLET | Freq: Every day | ORAL | 1 refills | Status: DC

## 2021-07-13 NOTE — Patient Instructions (Signed)
Sertraline Tablets What is this medication? SERTRALINE (SER tra leen) treats depression, anxiety, obsessive-compulsive disorder (OCD), post-traumatic stress disorder (PTSD), and premenstrual dysphoric disorder (PMDD). It increases the amount of serotonin in the brain, a hormone that helps regulate mood. It belongs to a group of medications called SSRIs. This medicine may be used for other purposes; ask your health care provider or pharmacist if you have questions. COMMON BRAND NAME(S): Zoloft What should I tell my care team before I take this medication? They need to know if you have any of these conditions: Bleeding disorders Bipolar disorder or a family history of bipolar disorder Frequently drink alcohol Glaucoma Heart disease High blood pressure History of irregular heartbeat History of low levels of calcium, magnesium, or potassium in the blood Liver disease Receiving electroconvulsive therapy Seizures Suicidal thoughts, plans, or attempt; a previous suicide attempt by you or a family member Take medications that prevent or treat blood clots Thyroid disease An unusual or allergic reaction to sertraline, other medications, foods, dyes, or preservatives Pregnant or trying to get pregnant Breast-feeding How should I use this medication? Take this medication by mouth with a glass of water. Follow the directions on the prescription label. You can take it with or without food. Take your medication at regular intervals. Do not take your medication more often than directed. Do not stop taking this medication suddenly except upon the advice of your care team. Stopping this medication too quickly may cause serious side effects or your condition may worsen. A special MedGuide will be given to you by the pharmacist with each prescription and refill. Be sure to read this information carefully each time. Talk to your care team about the use of this medication in children. While this medication may  be prescribed for children as young as 7 years for selected conditions, precautions do apply. Overdosage: If you think you have taken too much of this medicine contact a poison control center or emergency room at once. NOTE: This medicine is only for you. Do not share this medicine with others. What if I miss a dose? If you miss a dose, take it as soon as you can. If it is almost time for your next dose, take only that dose. Do not take double or extra doses. What may interact with this medication? Do not take this medication with any of the following: Cisapride Dronedarone Linezolid MAOIs like Carbex, Eldepryl, Marplan, Nardil, and Parnate Methylene blue (injected into a vein) Pimozide Thioridazine This medication may also interact with the following: Alcohol Amphetamines Aspirin and aspirin-like medications Certain medications for depression, anxiety, or other mental health conditions Certain medications for fungal infections like ketoconazole, fluconazole, posaconazole, and itraconazole Certain medications for irregular heart beat like flecainide, quinidine, propafenone Certain medications for migraine headaches like almotriptan, eletriptan, frovatriptan, naratriptan, rizatriptan, sumatriptan, zolmitriptan Certain medications for sleep Certain medications for seizures like carbamazepine, valproic acid, phenytoin Certain medications that treat or prevent blood clots like warfarin, enoxaparin, dalteparin Cimetidine Digoxin Diuretics Fentanyl Isoniazid Lithium NSAIDs, medications for pain and inflammation, like ibuprofen or naproxen Other medications that prolong the QT interval (cause an abnormal heart rhythm) like dofetilide Rasagiline Safinamide Supplements like St. John's wort, kava kava, valerian Tolbutamide Tramadol Tryptophan This list may not describe all possible interactions. Give your health care provider a list of all the medicines, herbs, non-prescription drugs, or  dietary supplements you use. Also tell them if you smoke, drink alcohol, or use illegal drugs. Some items may interact with your medicine. What should   I watch for while using this medication? Tell your care team if your symptoms do not get better or if they get worse. Visit your care team for regular checks on your progress. Because it may take several weeks to see the full effects of this medication, it is important to continue your treatment as prescribed by your care team. Patients and their families should watch out for new or worsening thoughts of suicide or depression. Also watch out for sudden changes in feelings such as feeling anxious, agitated, panicky, irritable, hostile, aggressive, impulsive, severely restless, overly excited and hyperactive, or not being able to sleep. If this happens, especially at the beginning of treatment or after a change in dose, call your care team. This medication may affect your coordination, reaction time, or judgment. Do not drive or operate machinery until you know how this medication affects you. Sit or stand up slowly to reduce the risk of dizzy or fainting spells. Drinking alcohol with this medication can increase the risk of these side effects. Your mouth may get dry. Chewing sugarless gum or sucking hard candy, and drinking plenty of water may help. Contact your care team if the problem does not go away or is severe. What side effects may I notice from receiving this medication? Side effects that you should report to your care team as soon as possible: Allergic reactions--skin rash, itching, hives, swelling of the face, lips, tongue, or throat Bleeding--bloody or black, tar-like stools, red or dark brown urine, vomiting blood or brown material that looks like coffee grounds, small red or purple spots on skin, unusual bleeding or bruising Heart rhythm changes--fast or irregular heartbeat, dizziness, feeling faint or lightheaded, chest pain, trouble  breathing Low sodium level--muscle weakness, fatigue, dizziness, headache, confusion Serotonin syndrome--irritability, confusion, fast or irregular heartbeat, muscle stiffness, twitching muscles, sweating, high fever, seizure, chills, vomiting, diarrhea Sudden eye pain or change in vision such as blurred vision, seeing halos around lights, vision loss Thoughts of suicide or self-harm, worsening mood Side effects that usually do not require medical attention (report these to your care team if they continue or are bothersome): Change in sex drive or performance Diarrhea Excessive sweating Nausea Tremors or shaking Upset stomach This list may not describe all possible side effects. Call your doctor for medical advice about side effects. You may report side effects to FDA at 1-800-FDA-1088. Where should I keep my medication? Keep out of the reach of children and pets. Store at room temperature between 15 and 30 degrees C (59 and 86 degrees F). Get rid of any unused medication after the expiration date. To get rid of medications that are no longer needed or expired: Take the medication to a medication take-back program. Check with your pharmacy or law enforcement to find a location. If you cannot return the medication, check the label or package insert to see if the medication should be thrown out in the garbage or flushed down the toilet. If you are not sure, ask your care team. If it is safe to put in the trash, empty the medication out of the container. Mix the medication with cat litter, dirt, coffee grounds, or other unwanted substance. Seal the mixture in a bag or container. Put it in the trash. NOTE: This sheet is a summary. It may not cover all possible information. If you have questions about this medicine, talk to your doctor, pharmacist, or health care provider.  2023 Elsevier/Gold Standard (2020-08-15 00:00:00)  

## 2021-07-13 NOTE — Progress Notes (Signed)
    GYNECOLOGY PROGRESS NOTE  Subjective:    Patient ID: Jodi Chapman, female    DOB: 1995-11-17, 26 y.o.   MRN: [id number]  HPI  Patient is a 26 y.o. G11P0102 female who presents for possible postpartum depression. She is approximately 5 months postpartum.  Is noting feeling more emotional and tearful, has less energy.  Is frustrated very easily. Does not desire to leave the home or the bed. Barely musters the energy to care for her twins. Overall does have good support at home with partner and other family but notes that it has been less lately.   Also has questions about why she has not resumed her menstrual cycles.  Has taken pregnancy tests which were negative. Notes that she is still breastfeeding.    The following portions of the patient's history were reviewed and updated as appropriate: allergies, current medications, past family history, past medical history, past social history, past surgical history, and problem list.  Review of Systems Pertinent items noted in HPI and remainder of comprehensive ROS otherwise negative.   Objective:   .vBlood pressure 119/67, pulse 66, resp. rate 16, height 5\' 7"  (1.702 m), weight 146 lb 12.8 oz (66.6 kg), currently breastfeeding. Body mass index is 22.99 kg/m.        07/13/2021   10:56 AM 02/12/2021   12:19 PM  Edinburgh Postnatal Depression Scale Screening Tool  I have been able to laugh and see the funny side of things. 1 0  I have looked forward with enjoyment to things. 2 1  I have blamed myself unnecessarily when things went wrong. 2 0  I have been anxious or worried for no good reason. 2 1  I have felt scared or panicky for no good reason. 1 1  Things have been getting on top of me. 2 1  I have been so unhappy that I have had difficulty sleeping. 1 0  I have felt sad or miserable. 2 1  I have been so unhappy that I have been crying. 3 1  The thought of harming myself has occurred to me. 1 0  Edinburgh Postnatal Depression  Scale Total 17 6     Assessment:   1. Postpartum depression   2. Lactational amenorrhea      Plan:   1. Postpartum depression Discussion had with patient today regarding symptoms.  Likely suffering from postpartum depression.  Currently no SI/HI.  Patient notes that overall she has good support at home.  Discussed options for management, including counseling and/or medications, or both.  Patient desires medication for now.  Will prescribe Zoloft 50 mg.  To follow up in 4 weeks.   2. Lactational amenorrhea - Discussed that lack of cycles is likely due to lactation. Explained that this was normal, and that her cycles would likely return when she begins to wean from breastfeeding.    02/14/2021, MD [facility]

## 2021-07-14 ENCOUNTER — Encounter: Payer: Medicaid Other | Admitting: Obstetrics and Gynecology

## 2021-08-10 ENCOUNTER — Telehealth: Payer: Medicaid Other | Admitting: Obstetrics and Gynecology

## 2021-09-21 ENCOUNTER — Telehealth: Payer: Medicaid Other | Admitting: Obstetrics and Gynecology
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
## 2021-10-09 NOTE — Patient Instructions (Incomplete)

## 2021-10-11 ENCOUNTER — Encounter: Payer: Self-pay | Admitting: Obstetrics and Gynecology

## 2021-10-11 ENCOUNTER — Ambulatory Visit (INDEPENDENT_AMBULATORY_CARE_PROVIDER_SITE_OTHER): Payer: Medicaid Other | Admitting: Obstetrics and Gynecology

## 2021-10-11 ENCOUNTER — Other Ambulatory Visit (HOSPITAL_COMMUNITY)
Admission: RE | Admit: 2021-10-11 | Discharge: 2021-10-11 | Disposition: A | Discharge status: Home / Self Care | Payer: Medicaid Other | Source: Ambulatory Visit | Attending: Obstetrics and Gynecology | Admitting: Obstetrics and Gynecology

## 2021-10-11 VITALS — BP 110/65 | HR 81 | Resp 16 | Ht 67.0 in | Wt 148.2 lb

## 2021-10-11 DIAGNOSIS — N760 Acute vaginitis: Secondary | ICD-10-CM | POA: Insufficient documentation

## 2021-10-11 DIAGNOSIS — Z01419 Encounter for gynecological examination (general) (routine) without abnormal findings: Secondary | ICD-10-CM | POA: Diagnosis not present

## 2021-10-11 DIAGNOSIS — F53 Postpartum depression: Secondary | ICD-10-CM

## 2021-10-13 ENCOUNTER — Other Ambulatory Visit: Payer: Self-pay | Admitting: Obstetrics and Gynecology

## 2021-10-13 LAB — CERVICOVAGINAL ANCILLARY ONLY
Bacterial Vaginitis (gardnerella): POSITIVE — AB
Candida Glabrata: NEGATIVE
Candida Vaginitis: NEGATIVE
Chlamydia: NEGATIVE
Comment: NEGATIVE
Comment: NEGATIVE
Comment: NEGATIVE
Comment: NEGATIVE
Comment: NEGATIVE
Comment: NORMAL
Neisseria Gonorrhea: NEGATIVE
Trichomonas: NEGATIVE

## 2021-10-13 MED ORDER — METRONIDAZOLE 500 MG PO TABS: 500.0000 mg | ORAL_TABLET | Freq: Two times a day (BID) | ORAL | 0 refills | Status: DC

## 2021-11-08 ENCOUNTER — Other Ambulatory Visit: Payer: Self-pay | Admitting: Obstetrics and Gynecology

## 2021-11-08 MED ORDER — METRONIDAZOLE 500 MG PO TABS: 500.0000 mg | ORAL_TABLET | Freq: Two times a day (BID) | ORAL | 0 refills | Status: DC

## 2022-04-13 ENCOUNTER — Encounter: Payer: Self-pay | Admitting: Obstetrics and Gynecology

## 2023-01-27 ENCOUNTER — Other Ambulatory Visit: Payer: Self-pay

## 2023-01-27 ENCOUNTER — Emergency Department: Payer: Medicaid Other

## 2023-01-27 ENCOUNTER — Encounter: Payer: Self-pay | Admitting: Emergency Medicine

## 2023-01-27 ENCOUNTER — Ambulatory Visit
Admission: EM | Admit: 2023-01-27 | Discharge: 2023-01-27 | Payer: Medicaid Other | Attending: Physician Assistant | Admitting: Physician Assistant

## 2023-01-27 ENCOUNTER — Emergency Department
Admission: EM | Admit: 2023-01-27 | Discharge: 2023-01-27 | Disposition: A | Discharge status: Home / Self Care | Payer: Medicaid Other | Attending: Emergency Medicine | Admitting: Emergency Medicine

## 2023-01-27 DIAGNOSIS — J45909 Unspecified asthma, uncomplicated: Secondary | ICD-10-CM | POA: Insufficient documentation

## 2023-01-27 DIAGNOSIS — K625 Hemorrhage of anus and rectum: Secondary | ICD-10-CM

## 2023-01-27 DIAGNOSIS — R197 Diarrhea, unspecified: Secondary | ICD-10-CM

## 2023-01-27 DIAGNOSIS — R109 Unspecified abdominal pain: Secondary | ICD-10-CM | POA: Diagnosis not present

## 2023-01-27 DIAGNOSIS — K529 Noninfective gastroenteritis and colitis, unspecified: Secondary | ICD-10-CM | POA: Insufficient documentation

## 2023-01-27 DIAGNOSIS — R112 Nausea with vomiting, unspecified: Secondary | ICD-10-CM

## 2023-01-27 LAB — COMPREHENSIVE METABOLIC PANEL
ALT: 14 U/L (ref 0–44)
AST: 16 U/L (ref 15–41)
Albumin: 4.5 g/dL (ref 3.5–5.0)
Alkaline Phosphatase: 51 U/L (ref 38–126)
Anion gap: 11 (ref 5–15)
BUN: 8 mg/dL (ref 6–20)
CO2: 21 mmol/L — ABNORMAL LOW (ref 22–32)
Calcium: 8.8 mg/dL — ABNORMAL LOW (ref 8.9–10.3)
Chloride: 103 mmol/L (ref 98–111)
Creatinine, Ser: 0.72 mg/dL (ref 0.44–1.00)
GFR, Estimated: >60 mL/min (ref >60)
Glucose, Bld: 108 mg/dL — ABNORMAL HIGH (ref 70–99)
Potassium: 3.4 mmol/L — ABNORMAL LOW (ref 3.5–5.1)
Sodium: 135 mmol/L (ref 135–145)
Total Bilirubin: 0.6 mg/dL (ref <1.2)
Total Protein: 7.9 g/dL (ref 6.5–8.1)

## 2023-01-27 LAB — URINALYSIS, COMPLETE (UACMP) WITH MICROSCOPIC
Bilirubin Urine: NEGATIVE
Glucose, UA: NEGATIVE mg/dL
Ketones, ur: 80 mg/dL — AB
Leukocytes,Ua: NEGATIVE
Nitrite: NEGATIVE
Protein, ur: 30 mg/dL — AB
Specific Gravity, Urine: 1.03 (ref 1.005–1.030)
pH: 5 (ref 5.0–8.0)

## 2023-01-27 LAB — CBC
HCT: 47 % — ABNORMAL HIGH (ref 36.0–46.0)
Hemoglobin: 15.9 g/dL — ABNORMAL HIGH (ref 12.0–15.0)
MCH: 30.3 pg (ref 26.0–34.0)
MCHC: 33.8 g/dL (ref 30.0–36.0)
MCV: 89.5 fL (ref 80.0–100.0)
Platelets: 307 10*3/uL (ref 150–400)
RBC: 5.25 MIL/uL — ABNORMAL HIGH (ref 3.87–5.11)
RDW: 12.8 % (ref 11.5–15.5)
WBC: 10.1 10*3/uL (ref 4.0–10.5)
nRBC: 0 % (ref 0.0–0.2)

## 2023-01-27 LAB — POC URINE PREG, ED: Preg Test, Ur: NEGATIVE

## 2023-01-27 LAB — LIPASE, BLOOD: Lipase: 22 U/L (ref 11–51)

## 2023-01-27 MED ORDER — ONDANSETRON 4 MG PO TBDP
4.0000 mg | ORAL_TABLET | Freq: Once | ORAL | Status: AC
  Administered 2023-01-27: 4 mg via ORAL

## 2023-01-27 MED ORDER — AMOXICILLIN-POT CLAVULANATE 875-125 MG PO TABS: 1.0000 | ORAL_TABLET | Freq: Two times a day (BID) | ORAL | 0 refills | Status: DC

## 2023-01-27 MED ORDER — HYDROCODONE-ACETAMINOPHEN 5-325 MG PO TABS: 1.0000 | ORAL_TABLET | ORAL | 0 refills | Status: DC | PRN

## 2023-01-27 MED ORDER — SODIUM CHLORIDE 0.9 % IV BOLUS
1000.0000 mL | Freq: Once | INTRAVENOUS | Status: AC
Start: 2023-01-27 — End: 2023-01-27
  Administered 2023-01-27: 1000 mL via INTRAVENOUS

## 2023-01-27 MED ORDER — IOHEXOL 300 MG/ML  SOLN
80.0000 mL | Freq: Once | INTRAMUSCULAR | Status: AC | PRN
  Administered 2023-01-27: 80 mL via INTRAVENOUS

## 2023-01-27 MED ORDER — ONDANSETRON 4 MG PO TBDP: 4.0000 mg | ORAL_TABLET | Freq: Three times a day (TID) | ORAL | 0 refills | Status: DC | PRN

## 2023-01-27 MED ORDER — AMOXICILLIN-POT CLAVULANATE 875-125 MG PO TABS
1.0000 | ORAL_TABLET | Freq: Once | ORAL | Status: AC
Start: 2023-01-27 — End: 2023-01-27
  Administered 2023-01-27: 1 via ORAL
  Filled 2023-01-27: qty 1

## 2023-01-27 MED ORDER — MORPHINE SULFATE (PF) 4 MG/ML IV SOLN
4.0000 mg | Freq: Once | INTRAVENOUS | Status: AC
  Administered 2023-01-27: 4 mg via INTRAVENOUS
  Filled 2023-01-27: qty 1

## 2023-01-27 MED ORDER — ONDANSETRON HCL 4 MG/2ML IJ SOLN
4.0000 mg | Freq: Once | INTRAMUSCULAR | Status: AC
  Administered 2023-01-27: 4 mg via INTRAVENOUS
  Filled 2023-01-27: qty 2

## 2023-01-27 NOTE — ED Notes (Signed)
Patient is being discharged from the Urgent Care and sent to the Emergency Department via POV . Per Dorann Ou, PA, patient is in need of higher level of care due to severe abdominal pain, nausea, vomiting, diarrhea, and needing imaging. Patient is aware and verbalizes understanding of plan of care.  Vitals:   01/27/23 1055  BP: 128/87  Pulse: 99  Resp: 18  Temp: 99.2 F (37.3 C)  SpO2: 98%

## 2023-01-27 NOTE — ED Triage Notes (Signed)
Pt to ED via POV from UC. Pt reports N/V/D x2 days. Pt reports yesterday at 6pm she noticed blood in her stool. Pt also reports lower abd pain and body aches. Pt reports using pepto-bismol with no relief.

## 2023-01-27 NOTE — ED Provider Notes (Signed)
[facility]    CSN: [id number] Arrival date & time: 01/27/23  1007      History   Chief Complaint Chief Complaint  Patient presents with   Diarrhea   Generalized Body Aches   Emesis    HPI Jodi Chapman is a 27 y.o. female.   Patient presents today with a 2-day history of severe abdominal pain, nausea, vomiting, diarrhea.  She reports that abdominal pain is rated 8 on a 0-10 pain scale, generalized throughout abdomen but worse in her lower abdomen, described as sharp/cramping, no alleviating factors notified.  She has also had diarrhea which she describes as 20+ watery bowel movements with associated bright red blood in 24 hours.  She has had nausea and vomiting with last episode of emesis a few hours ago.  She has tried Pepto-Bismol without improvement of symptoms.  She denies any known sick contacts.  Denies suspicious food intake other than what she had at Thanksgiving.  She denies any recent antibiotics or steroids.  She denies history of gastrointestinal disorder including ulcerative colitis or Crohn's disease.  She has no concern for pregnancy.  Denies previous abdominal surgeries so has gallbladder and appendix.    Past Medical History:  Diagnosis Date   Asthma    History of gestational hypertension 02/11/2021   Immunization, viral disease    GARDASIL SERIES COMPLETE    Patient Active Problem List   Diagnosis Date Noted   History of gestational hypertension 02/11/2021    Past Surgical History:  Procedure Laterality Date   WISDOM TOOTH EXTRACTION  03/29/2014    OB History     Gravida  1   Para  1   Term  0   Preterm  1   AB  0   Living  2      SAB  0   IAB  0   Ectopic  0   Multiple  1   Live Births  2            Home Medications    Prior to Admission medications   Medication Sig Start Date End Date Taking? Authorizing Provider  Lactic Ac-Citric Ac-Pot Bitart (PHEXXI) 1.8-1-0.4 % GEL Place 1 Applicatorful vaginally  daily as needed (as needed). Insert 1 applicator vaginally prn 5-45 min prior to intercourse.    [provider]  metroNIDAZOLE (FLAGYL) 500 MG tablet Take 1 tablet (500 mg total) by mouth 2 (two) times daily. 11/08/21   Hildred Laser, MD  Prenat-FeFum-FePo-FA-Omega 3 (CONCEPT DHA) 53.5-38-1 MG CAPS Take 1 capsule by mouth daily. 09/22/20   Linzie Collin, MD  sertraline (ZOLOFT) 50 MG tablet Take 1 tablet (50 mg total) by mouth daily. Begin with 1/2 tablet daily x 1 week, then increase to 1 tablet daily. 07/13/21   Hildred Laser, MD    Family History Family History  Problem Relation Age of Onset   Diabetes Mother    Hypertension Mother    Diabetes Maternal Grandmother    Hypertension Maternal Grandmother    Diabetes Maternal Grandfather    Hypertension Maternal Grandfather    Other Maternal Aunt        cx dysplasia   Ovarian cancer Maternal Aunt    Breast cancer Other        not sure of age, has contact    Social History Social History   Tobacco Use   Smoking status: Never   Smokeless tobacco: Never  Vaping Use   Vaping status: Never Used  Substance Use Topics   Alcohol use: No   Drug use: No     Allergies   Patient has no known allergies.   Review of Systems Review of Systems  Constitutional:  Positive for activity change, appetite change, chills and fatigue. Negative for fever.  Respiratory:  Negative for shortness of breath.   Cardiovascular:  Negative for chest pain.  Gastrointestinal:  Positive for abdominal pain, blood in stool, diarrhea, nausea and vomiting.  Genitourinary:  Negative for dysuria, frequency, urgency, vaginal bleeding, vaginal discharge and vaginal pain.     Physical Exam Triage Vital Signs ED Triage Vitals  Encounter Vitals Group     BP 01/27/23 1055 128/87     Systolic BP Percentile --      Diastolic BP Percentile --      Pulse Rate 01/27/23 1055 99     Resp 01/27/23 1055 18     Temp 01/27/23 1055 99.2 F (37.3 C)      Temp Source 01/27/23 1055 Oral     SpO2 01/27/23 1055 98 %     Weight --      Height --      Head Circumference --      Peak Flow --      Pain Score 01/27/23 1054 7     Pain Loc --      Pain Education --      Exclude from Growth Chart --    No data found.  Updated Vital Signs BP 128/87 (BP Location: Left Arm)   Pulse 99   Temp 99.2 F (37.3 C) (Oral)   Resp 18   LMP 12/29/2022   SpO2 98%   Visual Acuity Right Eye Distance:   Left Eye Distance:   Bilateral Distance:    Right Eye Near:   Left Eye Near:    Bilateral Near:     Physical Exam Vitals reviewed.  Constitutional:      General: She is awake. She is not in acute distress.    Appearance: Normal appearance. She is well-developed. She is ill-appearing.     Comments: Very pleasant female appears stated age curled up on in exam room table obviously uncomfortable but in no acute distress  HENT:     Head: Normocephalic and atraumatic.     Mouth/Throat:     Mouth: Mucous membranes are moist.  Cardiovascular:     Rate and Rhythm: Regular rhythm. Tachycardia present.     Heart sounds: Normal heart sounds, S1 normal and S2 normal. No murmur heard. Pulmonary:     Effort: Pulmonary effort is normal.     Breath sounds: Normal breath sounds. No wheezing, rhonchi or rales.     Comments: Clear to auscultation bilaterally Abdominal:     General: Bowel sounds are normal.     Palpations: Abdomen is soft.     Tenderness: There is generalized abdominal tenderness and tenderness in the right lower quadrant, suprapubic area and left lower quadrant. There is guarding and rebound. There is no right CVA tenderness or left CVA tenderness.     Comments: Significant tenderness palpation throughout lower abdomen with guarding and rebound tenderness.  Psychiatric:        Behavior: Behavior is cooperative.      UC Treatments / Results  Labs (all labs ordered are listed, but only abnormal results are displayed) Labs Reviewed - No  data to display  EKG   Radiology No results found.  Procedures Procedures (including critical care time)  Medications Ordered  in UC Medications  ondansetron (ZOFRAN-ODT) disintegrating tablet 4 mg (has no administration in time range)    Initial Impression / Assessment and Plan / UC Course  I have reviewed the triage vital signs and the nursing notes.  Pertinent labs & imaging results that were available during my care of the patient were reviewed by me and considered in my medical decision making (see chart for details).     Patient is ill-appearing has significant tenderness on exam.  Discussed that the safe thing to do is go to the emergency room since we do not have imaging or stat lab capabilities in urgent care.  She is agreeable to this and will go directly to [facility].  She was given a dose of Zofran prior to leaving to help manage her symptoms but discussed the importance of going directly there.  All questions were answered to patient satisfaction.  She was stable to time of discharge and will have her husband take her directly to the ER.  Final Clinical Impressions(s) / UC Diagnoses   Final diagnoses:  Continuous severe abdominal pain  BRBPR (bright red blood per rectum)  Nausea vomiting and diarrhea     Discharge Instructions      Given the severity of your symptoms I do recommend you go to the emergency room for further evaluation and management.  We have given you 1 dose of Zofran to help with your symptoms in the meantime but please go directly there.     ED Prescriptions   None    PDMP not reviewed this encounter.   Jeani Hawking, PA-C 01/27/23 1129

## 2023-01-27 NOTE — Discharge Instructions (Addendum)
Please take antibiotic as prescribed for its entire course.  Please take your pain and nausea medication as needed but only as written.  Do not drink alcohol or drive while taking pain medication.  Please call your primary care doctor/OB/GYN to arrange a follow-up appointment.  You will need an MRI of your liver to evaluate a 4.4 cm lesion to this area.  Please discuss this with your PCP to arrange for an outpatient MRI within the next several weeks.  Please also call the number for GI medicine to arrange a follow-up as he would likely need a colonoscopy in the near future.  As we discussed return to the emergency department if you are unable to tolerate your antibiotics due to nausea/vomiting, if your rectal bleeding increases or abdominal pain worsens or you develop a fever.

## 2023-01-27 NOTE — Discharge Instructions (Signed)
Given the severity of your symptoms I do recommend you go to the emergency room for further evaluation and management.  We have given you 1 dose of Zofran to help with your symptoms in the meantime but please go directly there.

## 2023-01-27 NOTE — ED Provider Notes (Signed)
[facility] Provider Note    Event Date/Time   First MD Initiated Contact with Patient 01/27/23 1146     (approximate)  History   Chief Complaint: Diarrhea  HPI  Jodi Chapman is a 27 y.o. female with a past medical history of asthma, presents to the emergency department for nausea vomiting diarrhea now with blood within the diarrhea.  According to the patient for the past 2 days she has been nauseated with vomiting and diarrhea as well as diffuse abdominal pain more so in the left lower quadrant.  Since last night she is also noted some blood within the stool and on the toilet paper.  Patient went to urgent care this morning for evaluation and was sent to the emergency department for further evaluation.  No family history of IBD.  No prior abdominal surgery.  Physical Exam   Triage Vital Signs: ED Triage Vitals  Encounter Vitals Group     BP 01/27/23 1143 134/82     Systolic BP Percentile --      Diastolic BP Percentile --      Pulse Rate 01/27/23 1143 100     Resp 01/27/23 1142 20     Temp 01/27/23 1142 97.8 F (36.6 C)     Temp Source 01/27/23 1142 Oral     SpO2 01/27/23 1143 98 %     Weight 01/27/23 1148 148 lb 2.4 oz (67.2 kg)     Height 01/27/23 1148 5\' 7"  (1.702 m)     Head Circumference --      Peak Flow --      Pain Score 01/27/23 1142 6     Pain Loc --      Pain Education --      Exclude from Growth Chart --     Most recent vital signs: Vitals:   01/27/23 1142 01/27/23 1143  BP:  134/82  Pulse:  100  Resp: 20 20  Temp: 97.8 F (36.6 C)   SpO2:  98%    General: Awake, appears mildly uncomfortable. CV:  Good peripheral perfusion.  Regular rate and rhythm  Resp:  Normal effort.  Equal breath sounds bilaterally.  Abd:  No distention.  Soft, moderate left lower quadrant and suprapubic tenderness.  Otherwise mild diffuse tenderness.  No rebound or guarding.  ED Results / Procedures / Treatments   RADIOLOGY  I have reviewed  and interpreted CT images.  Patient appears to have thickened descending colon likely colitis. Radiology confirms colonic wall thickening with stranding consistent with colitis.  No obstruction.  Patient also has a 4.4 cm mass along the liver which I have discussed with the patient she will follow-up with her doctor for an MRI.   MEDICATIONS ORDERED IN ED: Medications  sodium chloride 0.9 % bolus 1,000 mL (has no administration in time range)  ondansetron (ZOFRAN) injection 4 mg (has no administration in time range)  morphine (PF) 4 MG/ML injection 4 mg (has no administration in time range)     IMPRESSION / MDM / ASSESSMENT AND PLAN / ED COURSE  I reviewed the triage vital signs and the nursing notes.  Patient's presentation is most consistent with acute presentation with potential threat to life or bodily function.  Patient presents to the emergency department for nausea vomiting diarrhea over the last 2 days now with blood within the diarrhea noted today.  No history of the same.  No family history of IBD.  Patient states diffuse abdominal pain over the  more so in the left lower quadrant.  Differential would include colitis infectious versus inflammatory, gastroenteritis, intra-abdominal pathology, diverticulitis.  We will check labs obtain CT imaging and treat the patient's pain and nausea IV hydrate while awaiting results.  Patient agreeable to plan of care.  CT confirms colitis.  Patient also has a mass along the liver.  I discussed this with the patient and she will follow-up with her OB/GYN/PCP Dr. Valentino Saxon to arrange for outpatient imaging.  We will also refer to GI medicine for further evaluation as the patient will likely need a colonoscopy once her symptoms resolve.  We will treat with Augmentin twice daily as well as prescribe a short course of pain and nausea medication for the patient.  I discussed return precautions for any worsening abdominal pain vomiting unable to keep down  antibiotics or increased rectal bleeding.  FINAL CLINICAL IMPRESSION(S) / ED DIAGNOSES   Colitis   Note:  This document was prepared using Dragon voice recognition software and may include unintentional dictation errors.   Minna Antis, MD 01/27/23 1407

## 2023-01-27 NOTE — ED Triage Notes (Signed)
Pt presents with severe abdominal cramping, diarrhea and vomiting x 2 days. She started to see blood in her stool yesterday. Pt has tried Pepto-Bismol for her symptoms.

## 2023-02-05 ENCOUNTER — Encounter: Payer: Self-pay | Admitting: Obstetrics and Gynecology

## 2023-02-05 ENCOUNTER — Ambulatory Visit: Payer: Medicaid Other | Admitting: Obstetrics and Gynecology

## 2023-02-05 VITALS — BP 107/74 | HR 73 | Resp 16 | Ht 67.0 in | Wt 141.7 lb

## 2023-02-05 DIAGNOSIS — B379 Candidiasis, unspecified: Secondary | ICD-10-CM

## 2023-02-05 DIAGNOSIS — B3731 Acute candidiasis of vulva and vagina: Secondary | ICD-10-CM | POA: Diagnosis not present

## 2023-02-05 DIAGNOSIS — R160 Hepatomegaly, not elsewhere classified: Secondary | ICD-10-CM

## 2023-02-05 DIAGNOSIS — K529 Noninfective gastroenteritis and colitis, unspecified: Secondary | ICD-10-CM | POA: Diagnosis not present

## 2023-02-05 DIAGNOSIS — T3695XA Adverse effect of unspecified systemic antibiotic, initial encounter: Secondary | ICD-10-CM

## 2023-02-05 MED ORDER — FLUCONAZOLE 150 MG PO TABS
150.0000 mg | ORAL_TABLET | Freq: Once | ORAL | 1 refills | Status: AC
Start: 2023-02-05 — End: 2023-02-05

## 2023-02-05 NOTE — Progress Notes (Signed)
GYNECOLOGY PROGRESS NOTE  Subjective:    Patient ID: Jodi Chapman, female    DOB: [birth year], 27 y.o.   MRN: [id number]  HPI  Patient is a 27 y.o. [id number] female who presents for hospital follow up. She was evaluated at ED on 01/27/2023. She presented with nausea vomiting diarrhea for  2 days with associated diarrhea after the 2nd day. Initially thought it was food poisoning, but as symptoms worsened, decided to be evaluated. She was diagnosed with acute colitis and discharged home with antibiotics (Augmentin) to take for 10 days. She notes today that she feels a lot better than she had been feeling. She continues to take antibiotics as prescribed. She has noticed some vaginal irritation and itching over the past 2 days and thinks she may be getting a yeast infection from the antibiotics. Denies discharge or vaginal odor. Has not tried anything for relief.   Of note, incidental finding of a  4 cm liver lesion was noted on CT scan during ER evaluation.  Recommende f/u evaluation with MRI. ER provider recommended with f/u with GI regarding liver mass and colitis (may need colonoscopy 6 weeks after treatment).  Patient denies any previous history of colitis, however does report being worked up for IBS greater than 5 years ago, however that workup was negative.  The following portions of the patient's history were reviewed and updated as appropriate: allergies, current medications, past family history, past medical history, past social history, past surgical history, and problem list.  Review of Systems Pertinent items are noted in HPI.   Objective:   Blood pressure 107/74, pulse 73, resp. rate 16, height 5\' 7"  (1.702 m), weight 141 lb 11.2 oz (64.3 kg), last menstrual period 12/29/2022, not currently breastfeeding.  Body mass index is 22.19 kg/m. General appearance: alert, cooperative, and no distress Abdomen: soft, non-tender; bowel sounds normal; no masses,  no organomegaly Vaginal  exam: Declined Remainder of exam deferred.  Labs:  Admission on 01/27/2023, Discharged on 01/27/2023  Component Date Value Ref Range Status   WBC 01/27/2023 10.1  4.0 - 10.5 K/uL Final   RBC 01/27/2023 5.25 (H)  3.87 - 5.11 MIL/uL Final   Hemoglobin 01/27/2023 15.9 (H)  12.0 - 15.0 g/dL Final   HCT 74/25/9563 47.0 (H)  36.0 - 46.0 % Final   MCV 01/27/2023 89.5  80.0 - 100.0 fL Final   MCH 01/27/2023 30.3  26.0 - 34.0 pg Final   MCHC 01/27/2023 33.8  30.0 - 36.0 g/dL Final   RDW 87/56/4332 12.8  11.5 - 15.5 % Final   Platelets 01/27/2023 307  150 - 400 K/uL Final   nRBC 01/27/2023 0.0  0.0 - 0.2 % Final   Performed at [facility], [address] [place], [place] [address]   Sodium 01/27/2023 135  135 - 145 mmol/L Final   Potassium 01/27/2023 3.4 (L)  3.5 - 5.1 mmol/L Final   Chloride 01/27/2023 103  98 - 111 mmol/L Final   CO2 01/27/2023 21 (L)  22 - 32 mmol/L Final   Glucose, Bld 01/27/2023 108 (H)  70 - 99 mg/dL Final   Glucose reference range applies only to samples taken after fasting for at least 8 hours.   BUN 01/27/2023 8  6 - 20 mg/dL Final   Creatinine, Ser 01/27/2023 0.72  0.44 - 1.00 mg/dL Final   Calcium 41/66/0630 8.8 (L)  8.9 - 10.3 mg/dL Final   Total Protein 16/02/930 7.9  6.5 - 8.1 g/dL Final  Albumin 01/27/2023 4.5  3.5 - 5.0 g/dL Final   AST 16/11/9602 16  15 - 41 U/L Final   ALT 01/27/2023 14  0 - 44 U/L Final   Alkaline Phosphatase 01/27/2023 51  38 - 126 U/L Final   Total Bilirubin 01/27/2023 0.6  <1.2 mg/dL Final   GFR, Estimated 01/27/2023 >60  >60 mL/min Final   Comment: (NOTE) Calculated using the CKD-EPI Creatinine Equation (2021)    Anion gap 01/27/2023 11  5 - 15 Final   Performed at [facility], [address] [place], [place] [address]   Lipase 01/27/2023 22  11 - 51 U/L Final   Performed at [facility], [address] [place], [place] [address]   Color, Urine 01/27/2023 YELLOW (A)  YELLOW Final   APPearance  01/27/2023 HAZY (A)  CLEAR Final   Specific Gravity, Urine 01/27/2023 1.030  1.005 - 1.030 Final   pH 01/27/2023 5.0  5.0 - 8.0 Final   Glucose, UA 01/27/2023 NEGATIVE  NEGATIVE mg/dL Final   Hgb urine dipstick 01/27/2023 MODERATE (A)  NEGATIVE Final   Bilirubin Urine 01/27/2023 NEGATIVE  NEGATIVE Final   Ketones, ur 01/27/2023 80 (A)  NEGATIVE mg/dL Final   Protein, ur 78/29/5621 30 (A)  NEGATIVE mg/dL Final   Nitrite 30/86/5784 NEGATIVE  NEGATIVE Final   Leukocytes,Ua 01/27/2023 NEGATIVE  NEGATIVE Final   RBC / HPF 01/27/2023 0-5  0 - 5 RBC/hpf Final   WBC, UA 01/27/2023 0-5  0 - 5 WBC/hpf Final   Bacteria, UA 01/27/2023 RARE (A)  NONE SEEN Final   Squamous Epithelial / HPF 01/27/2023 11-20  0 - 5 /HPF Final   Mucus 01/27/2023 PRESENT   Final   Performed at [facility] [place], [address] [place], [place] [address]   Preg Test, Ur 01/27/2023 Negative  Negative Final    Imaging:  CT ABDOMEN PELVIS W CONTRAST CLINICAL DATA:  Abdominal pain.  Bloody diarrhea.  EXAM: CT ABDOMEN AND PELVIS WITH CONTRAST  TECHNIQUE: Multidetector CT imaging of the abdomen and pelvis was performed using the standard protocol following bolus administration of intravenous contrast.  RADIATION DOSE REDUCTION: This exam was performed according to the departmental dose-optimization program which includes automated exposure control, adjustment of the mA and/or kV according to patient size and/or use of iterative reconstruction technique.  CONTRAST:  80mL OMNIPAQUE IOHEXOL 300 MG/ML  SOLN  COMPARISON:  Renal stone CT 04/09/2017.  FINDINGS: Lower chest: Lung bases are clear. No pleural effusion. 3 mm juxtapleural nodule left lower lobe series 4, image 15, unchanged from prior. No specific imaging follow-up. Slight breathing motion.  Hepatobiliary: Patent portal vein. Gallbladder is nondilated. There is focal mass identified along the posterior aspect of segment 4 near the liver hilum  measuring 4.4 by 2.9 cm. The central low-density component. This has a differential but could be a focal nodular hyperplasia. Please correlate for any known history or dedicated confirmatory workup is recommended with MRI and Eovist imaging.  Pancreas: Unremarkable. No pancreatic ductal dilatation or surrounding inflammatory changes.  Spleen: Spleen is slightly enlarged with a cephalocaudal length of 13.3 cm. Preserved enhancement  Adrenals/Urinary Tract: Adrenal glands are preserved. No enhancing renal mass or collecting system dilatation. The ureters have normal course and caliber extending down to the bladder. Preserved contours of the urinary bladder  Stomach/Bowel: No oral contrast. The stomach and small bowel are nondilated. Large bowel is nondilated but has significant wall thickening over long segments of the colon greatest descending and  rectosigmoid but also transverse and ascending colon to some extent. Please correlate for a diffuse colitis appendix not well seen in the right lower quadrant but no pericecal stranding or fluid.  Vascular/Lymphatic: No significant vascular findings are present. No enlarged abdominal or pelvic lymph nodes. Few prominent mesenteric nodes identified which may be reactive.  Reproductive: Uterus is present. Small cystic area in the left adnexa measures 18 mm. Likely ovarian. No specific imaging follow-up based on appearance and the patient's age  Other: Trace free fluid in the pelvis.  No free air.  Musculoskeletal: Schmorl's node changes identified.  IMPRESSION: Long segment colonic wall thickening with some stranding. Colitis. No obstruction.  Small amount of free fluid in the pelvis, nonspecific.  Presumably enhancing mass along the posterior aspect of segment 4 of the liver measuring 4.4 cm. Based on appearance this very well could be an etiology such as a focal nodular hyperplasia. Please correlate for any known history or  dedicated confirmatory MRI is recommended when appropriate with Eovist contrast agent.  Mild splenic enlargement  Electronically Signed   By: Karen Kays M.D.   On: 01/27/2023 13:50   Assessment:   1. Colitis   2. Antibiotic-induced yeast infection   3. Liver mass      Plan:   1. Colitis -Continue treatment of colitis with Augmentin for total of 7 days.  Patient notes she has a day and a half of medication left.  Advised to continue medications until completion of course. - Ambulatory referral to Gastroenterology.  The patient notes that she may be relocating to [place] within the next month or so.  Will have initial follow-up and can plan for outpatient colonoscopy when she has established care in her new location.  2. Antibiotic-induced yeast infection -Patient thinks she may be developing a yeast infection due to her use of antibiotics.  I discussed that this is common with medications such as Augmentin.  Offered vaginal culture as she also notes a history of dealing with BV infections in the past, however patient notes that she believes this is yeast.  Does not have any of her typical symptoms for BV.  Will send prescription for Diflucan, also advised that she can use over-the-counter Monistat or vaginal still for relief. - fluconazole (DIFLUCAN) 150 MG tablet; Take 1 tablet (150 mg total) by mouth once for 1 dose.  Dispense: 1 tablet; Refill: 1  3. Liver mass -Patient with incidental finding of liver mass on recent CT scan.  Normal liver enzymes noted.  Radiologist recommended follow-up MRI for further evaluation of the mass.  Order placed.  Also refer to GI - Ambulatory referral to Gastroenterology - MR ABDOMEN WWO CONTRAST; Future    A total of 32 minutes were spent during this encounter, including review of previous progress notes, recent imaging and labs, face-to-face with time with patient involving counseling and coordination of care, as well as documentation for current  visit.  Hildred Laser, MD [facility]

## 2023-02-26 ENCOUNTER — Ambulatory Visit
Admission: RE | Admit: 2023-02-26 | Discharge: 2023-02-26 | Disposition: A | Discharge status: Home / Self Care | Payer: Medicaid Other | Source: Ambulatory Visit | Attending: Obstetrics and Gynecology | Admitting: Obstetrics and Gynecology

## 2023-02-26 DIAGNOSIS — R160 Hepatomegaly, not elsewhere classified: Secondary | ICD-10-CM

## 2023-02-26 MED ORDER — GADOXETATE DISODIUM 0.25 MMOL/ML IV SOLN
7.0000 mL | Freq: Once | INTRAVENOUS | Status: AC | PRN
  Administered 2023-02-26: 7 mL via INTRAVENOUS

## 2023-02-28 ENCOUNTER — Other Ambulatory Visit: Payer: Medicaid Other

## 2023-04-18 ENCOUNTER — Ambulatory Visit: Payer: Medicaid Other | Admitting: Gastroenterology

## 2023-07-04 ENCOUNTER — Encounter: Payer: Self-pay | Admitting: Pediatrics

## 2023-07-04 ENCOUNTER — Ambulatory Visit (INDEPENDENT_AMBULATORY_CARE_PROVIDER_SITE_OTHER): Payer: MEDICAID | Admitting: Pediatrics

## 2023-07-04 VITALS — BP 123/80 | HR 73 | Ht 66.0 in | Wt 146.2 lb

## 2023-07-04 DIAGNOSIS — Z13220 Encounter for screening for lipoid disorders: Secondary | ICD-10-CM

## 2023-07-04 DIAGNOSIS — J45909 Unspecified asthma, uncomplicated: Secondary | ICD-10-CM | POA: Diagnosis not present

## 2023-07-04 DIAGNOSIS — Z1330 Encounter for screening examination for mental health and behavioral disorders, unspecified: Secondary | ICD-10-CM | POA: Diagnosis not present

## 2023-07-04 DIAGNOSIS — Z131 Encounter for screening for diabetes mellitus: Secondary | ICD-10-CM

## 2023-07-04 DIAGNOSIS — R4586 Emotional lability: Secondary | ICD-10-CM

## 2023-07-04 DIAGNOSIS — J019 Acute sinusitis, unspecified: Secondary | ICD-10-CM

## 2023-07-04 DIAGNOSIS — R41840 Attention and concentration deficit: Secondary | ICD-10-CM

## 2023-07-04 DIAGNOSIS — Z7689 Persons encountering health services in other specified circumstances: Secondary | ICD-10-CM

## 2023-07-04 MED ORDER — BUDESONIDE-FORMOTEROL FUMARATE 160-4.5 MCG/ACT IN AERO
1.0000 | INHALATION_SPRAY | RESPIRATORY_TRACT | 3 refills | Status: DC | PRN
Start: 2023-07-04 — End: 2023-11-07

## 2023-07-04 NOTE — Progress Notes (Signed)
 Establish Care Note  BP 123/80   Pulse 73   Ht 5\' 6"  (1.676 m)   Wt 146 lb 3.2 oz (66.3 kg)   LMP 06/15/2023 (Exact Date)   SpO2 98%   BMI 23.60 kg/m    Subjective:    Patient ID: Jodi Chapman, female    DOB: 1995-09-09, 28 y.o.   MRN: [id number]  HPI: Jodi Chapman is a 28 y.o. female  Chief Complaint  Patient presents with   Establish Care    ADHD testing , ears clogged and coughing    Establishing care, the following was discussed today:  Discussed the use of AI scribe software for clinical note transcription with the patient, who gave verbal consent to proceed.  History of Present Illness   [name] is a 28 year old female with asthma who presents with persistent coughing and wheezing.  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 Recently, she has experienced daily symptoms, including coughing and wheezing, exacerbated by altitude changes and physical activity such as hiking. Symptoms are less pronounced while relaxing. Over the past two to three weeks, she has been experiencing nightly coughing fits lasting 30 minutes to an hour, disrupting her sleep.  She has been using her nana's albuterol inhaler, which provides some relief for her wheezing but does not significantly alleviate her cough. She also tried a prescribed cough medication that numbs the throat, but it did not effectively reduce her coughing or help her sleep.  She describes symptoms suggestive of a sinus infection, including ear clogging, facial pain, and pressure, which began after returning from a trip to [place] . Her right ear was completely clogged, causing muffled hearing and discomfort, which has since improved but remains tender. She attempted to use an over-the-counter ear cleaning solution, but it caused irritation without resolving the clogging.  She expresses concerns about having ADHD, describing symptoms such as difficulty focusing,  feeling restless, and having multiple thoughts simultaneously. She has discussed these symptoms with her mother and her mother's partner, who has ADHD, and they suggested she might have it as well. She reports these symptoms have been present since childhood and affect her in various settings.        Current Outpatient Medications on File Prior to Visit  Medication Sig Dispense Refill   amoxicillin -clavulanate (AUGMENTIN ) 875-125 MG tablet Take 1 tablet by mouth 2 (two) times daily. (Patient not taking: Reported on 07/04/2023) 20 tablet 0   No current facility-administered medications on file prior to visit.    #HM Will review HM records and updated as needed.  Relevant past medical, surgical, family and social history reviewed and updated as indicated. Interim medical history since our last visit reviewed. Allergies and medications reviewed and updated.  ROS per HPI unless specifically indicated above     Objective:     BP 123/80   Pulse 73   Ht 5\' 6"  (1.676 m)   Wt 146 lb 3.2 oz (66.3 kg)   LMP 06/15/2023 (Exact Date)   SpO2 98%   BMI 23.60 kg/m   Wt Readings from Last 3 Encounters:  07/04/23 146 lb 3.2 oz (66.3 kg)  02/05/23 141 lb 11.2 oz (64.3 kg)  01/27/23 148 lb 2.4 oz (67.2 kg)     Physical Exam Constitutional:      Appearance: Normal appearance.  Pulmonary:     Effort: Pulmonary effort is normal.  Musculoskeletal:  General: Normal range of motion.  Skin:    Comments: Normal skin color  Neurological:     General: No focal deficit present.     Mental Status: She is alert. Mental status is at baseline.  Psychiatric:        Mood and Affect: Mood normal.        Behavior: Behavior normal.        Thought Content: Thought content normal.         07/04/2023    3:34 PM 10/11/2021    2:23 PM  Depression screen PHQ 2/9  Decreased Interest 1 1  Down, Depressed, Hopeless 1 0  PHQ - 2 Score 2 1  Altered sleeping 2 1  Tired, decreased energy 1 1  Change in  appetite 1 1  Feeling bad or failure about yourself  1 0  Trouble concentrating 3 1  Moving slowly or fidgety/restless 0 1  Suicidal thoughts 0 0  PHQ-9 Score 10 6  Difficult doing work/chores Somewhat difficult Somewhat difficult        07/04/2023    3:34 PM 10/11/2021    2:23 PM  GAD 7 : Generalized Anxiety Score  Nervous, Anxious, on Edge 0 0  Control/stop worrying 0 0  Worry too much - different things 1 1  Trouble relaxing 1 1  Restless 1 1  Easily annoyed or irritable 2 1  Afraid - awful might happen 0 0  Total GAD 7 Score 5 4  Anxiety Difficulty Somewhat difficult Not difficult at all       Assessment & Plan:   Assessment & Plan   Moderate asthma, unspecified whether complicated, unspecified whether persistent Assessment & Plan: Chronic asthma with recent exacerbation. Symptoms worsened over 2-3 weeks, likely due to altitude and environmental factors. Albuterol provides limited relief. Discussed Symbicort  for better symptom management. - Prescribe Symbicort  inhaler as needed, max 12 puffs/day. - Continue albuterol inhaler as needed for acute symptoms. - Reassess asthma control after Symbicort  trial.  Orders: -     Budesonide -Formoterol  Fumarate; Inhale 1-2 puffs into the lungs as needed (1 to 2 puffs as needed, MAX 12 puffs a day).  Dispense: 1 each; Refill: 3  Concentration deficit Symptoms suggestive of ADHD impacting daily life. Evaluation needed to differentiate from anxiety or depression. Discussed evaluation process. ARSR testing positive, prompting further screening. - Schedule ADHD evaluation for Jul 18, 2023, at 2:20 PM, virtually. - Provide ADHD screening form for completion before evaluation.   Subacute sinusitis, unspecified location Suspected sinusitis with ear clogging, facial pressure, and pain. Symptoms improved but right ear canal remains tender and red. Advised against further over-the-counter ear drops. - Consider steroid drops for right ear if no  improvement. - Message via MyChart if symptoms worsen or persist.  Diabetes mellitus screening -     Hemoglobin A1c  Lipid screening -     Lipid panel  Mood changes -     CBC with Differential/Platelet -     TSH -     Comprehensive metabolic panel with GFR  Encounter for behavioral health screening As part of their intake evaluation, the patient was screened for depression, anxiety.  PHQ9 SCORE 10, GAD7 SCORE 5. Screening results positive for tested conditions. See plan under problem/diagnosis above.  Encounter to establish care Reviewed available patient record including history, medications, problem list. HM updated as able. Will review and/or request outside records (if applicable) and will fill remaining HM gaps as needed at follow up visit.  Follow up plan: Return in about 2 weeks (around 07/18/2023) for adhd.  Hadassah Letters, MD

## 2023-07-04 NOTE — Patient Instructions (Signed)

## 2023-07-05 LAB — CBC WITH DIFFERENTIAL/PLATELET
Basophils Absolute: 0 10*3/uL (ref 0.0–0.2)
Basos: 0 %
EOS (ABSOLUTE): 0.1 10*3/uL (ref 0.0–0.4)
Eos: 1 %
Hematocrit: 43.9 % (ref 34.0–46.6)
Hemoglobin: 14.8 g/dL (ref 11.1–15.9)
Immature Grans (Abs): 0 10*3/uL (ref 0.0–0.1)
Immature Granulocytes: 0 %
Lymphocytes Absolute: 3.1 10*3/uL (ref 0.7–3.1)
Lymphs: 30 %
MCH: 29.9 pg (ref 26.6–33.0)
MCHC: 33.7 g/dL (ref 31.5–35.7)
MCV: 89 fL (ref 79–97)
Monocytes Absolute: 0.6 10*3/uL (ref 0.1–0.9)
Monocytes: 5 %
Neutrophils Absolute: 6.4 10*3/uL (ref 1.4–7.0)
Neutrophils: 64 %
Platelets: 421 10*3/uL (ref 150–450)
RBC: 4.95 x10E6/uL (ref 3.77–5.28)
RDW: 12.1 % (ref 11.7–15.4)
WBC: 10.2 10*3/uL (ref 3.4–10.8)

## 2023-07-05 LAB — COMPREHENSIVE METABOLIC PANEL WITH GFR
ALT: 12 IU/L (ref 0–32)
AST: 10 IU/L (ref 0–40)
Albumin: 4.7 g/dL (ref 4.0–5.0)
Alkaline Phosphatase: 65 IU/L (ref 44–121)
BUN/Creatinine Ratio: 14 (ref 9–23)
BUN: 11 mg/dL (ref 6–20)
Bilirubin Total: 0.2 mg/dL (ref 0.0–1.2)
CO2: 23 mmol/L (ref 20–29)
Calcium: 10 mg/dL (ref 8.7–10.2)
Chloride: 103 mmol/L (ref 96–106)
Creatinine, Ser: 0.77 mg/dL (ref 0.57–1.00)
Globulin, Total: 2.8 g/dL (ref 1.5–4.5)
Glucose: 88 mg/dL (ref 70–99)
Potassium: 4.1 mmol/L (ref 3.5–5.2)
Sodium: 141 mmol/L (ref 134–144)
Total Protein: 7.5 g/dL (ref 6.0–8.5)
eGFR: 109 mL/min/{1.73_m2} (ref >59)

## 2023-07-05 LAB — HEMOGLOBIN A1C
Est. average glucose Bld gHb Est-mCnc: 108 mg/dL
Hgb A1c MFr Bld: 5.4 % (ref 4.8–5.6)

## 2023-07-05 LAB — LIPID PANEL
Chol/HDL Ratio: 3.4 ratio (ref 0.0–4.4)
Cholesterol, Total: 127 mg/dL (ref 100–199)
HDL: 37 mg/dL — ABNORMAL LOW (ref >39)
LDL Chol Calc (NIH): 69 mg/dL (ref 0–99)
Triglycerides: 113 mg/dL (ref 0–149)
VLDL Cholesterol Cal: 21 mg/dL (ref 5–40)

## 2023-07-05 LAB — TSH: TSH: 1.24 u[IU]/mL (ref 0.450–4.500)

## 2023-07-10 ENCOUNTER — Encounter: Payer: Self-pay | Admitting: Pediatrics

## 2023-07-10 NOTE — Assessment & Plan Note (Signed)
 Chronic asthma with recent exacerbation. Symptoms worsened over 2-3 weeks, likely due to altitude and environmental factors. Albuterol provides limited relief. Discussed Symbicort  for better symptom management. - Prescribe Symbicort  inhaler as needed, max 12 puffs/day. - Continue albuterol inhaler as needed for acute symptoms. - Reassess asthma control after Symbicort  trial.

## 2023-07-18 ENCOUNTER — Telehealth (INDEPENDENT_AMBULATORY_CARE_PROVIDER_SITE_OTHER): Payer: MEDICAID | Admitting: Pediatrics

## 2023-07-18 ENCOUNTER — Encounter: Payer: Self-pay | Admitting: Pediatrics

## 2023-07-18 DIAGNOSIS — F902 Attention-deficit hyperactivity disorder, combined type: Secondary | ICD-10-CM

## 2023-07-18 NOTE — Progress Notes (Addendum)
 Televisit  LMP 06/15/2023 (Exact Date)    Subjective:    Patient ID: Jodi Chapman, female    DOB: Oct 15, 1995, 28 y.o.   MRN: [id number]  HPI: Jodi Chapman is a 28 y.o. female  Chief Complaint  Patient presents with   ADHD    Eval   I connected with  ANNAKA CLEAVER on 07/30/23 by a video enabled telemedicine application and verified that I am speaking with the correct person using two identifiers.   I discussed the limitations of evaluation and management by telemedicine. The patient expressed understanding and agreed to proceed.  SUBJECTIVE  Patient presents today with a complaint of poor concentration/focus. She reports that hyperactivity.  She reports that symptoms began as a child.   She has not been diagnosed with ADHD previously.  She reports that her problem with concentration affects the following functional areas: work.  She does recall getting in trouble    She notices the symptoms She endorses the following symptoms:   Hyperactivity(6/9 for children, 5/9 for adults)  [x]  Often fidgets with or taps hands or feet or squirms in seat  []  Difficulty remaining seated when sitting is required (eg, at school, work, etc)  [x]  Feelings of restlessness (in adolescents/adults) or inappropriate running around or climbing in younger children  [x]  Difficulty playing or doing leisure activities quietly  [x]  Difficult to keep up with, seeming to always be "on the go" (may be experienced by others as being restless or difficult to keep up with)   []  Excessive talking  []  Difficulty waiting turns (while in line for example)  []  Blurting out answers too quickly (completes people' sentences; cannot wait for turn in conversation)  [x]  Interruption or intrusion of others (may intrude into or take over what others are doing)  Inattention (6/9 for children, 5/9 for adults) [x]  Failure to provide close attention to detail, careless mistakes [x]  Difficulty maintaining  attention in play, school, or home activities (difficulty remaining focused during lectures, conversations, or lengthy reading) [x]  Seems not to listen, even when directly addressed [x]  Fails to follow through  (ex: homework, chores, workplace duties, misses deadlines) [x]  Difficulty organizing tasks, activities, and belongings (difficulty managing sequential tasks) [x]  Avoids tasks that require consistent mental effort (preparing reports, completing forms, reviewing lengthy papers) [x]  Loses objects required for tasks or activities (eg, school books, sports equipment, phones, etc) [x]  Easily distracted by irrelevant stimuli (maybe unrelated thoughts) [x]  Forgetfulness in routine activities (eg, homework, chores, paying bills, keeping appointments, etc)  In regards to mood the patient reports: Irritable sometimes, easily triggered  Most days feels happy She feels bandwidth is shorter Does feel difficulty with motivation   In regards to anxiety the patient reports: She is stable, sometimes when accomplishing tasks  She does some organization preferences  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 Looking into therapy.   Substance Use:  Wine socially  Cardiac History:  Uncontrolled HTN: no Arrhythmias: no Structural heart disease: no  Review of Systems:  Card: no palpitations Neuro: no insomnia or headaches Psychiatric: no worsening anxiety GI: no stomach aches   Relevant past medical, surgical, family and social history reviewed and updated as indicated. Interim medical history since our last visit reviewed. Allergies and medications reviewed and updated.  ROS per HPI unless specifically indicated above     Objective:     LMP 06/15/2023 (Exact Date)   Wt  Readings from Last 3 Encounters:  07/04/23 146 lb 3.2 oz (66.3 kg)  02/05/23 141 lb 11.2 oz (64.3 kg)  01/27/23 148 lb 2.4 oz (67.2 kg)     Physical Exam Constitutional:      General: She is not in acute distress.    Appearance: Normal appearance.  Neurological:     General: No focal deficit present.     Mental Status: She is alert. Mental status is at baseline.         07/04/2023    3:34 PM 10/11/2021    2:23 PM  Depression screen PHQ 2/9  Decreased Interest 1 1  Down, Depressed, Hopeless 1 0  PHQ - 2 Score 2 1  Altered sleeping 2 1  Tired, decreased energy 1 1  Change in appetite 1 1  Feeling bad or failure about yourself  1 0  Trouble concentrating 3 1  Moving slowly or fidgety/restless 0 1  Suicidal thoughts 0 0  PHQ-9 Score 10 6  Difficult doing work/chores Somewhat difficult Somewhat difficult       07/04/2023    3:34 PM 10/11/2021    2:23 PM  GAD 7 : Generalized Anxiety Score  Nervous, Anxious, on Edge 0 0  Control/stop worrying 0 0  Worry too much - different things 1 1  Trouble relaxing 1 1  Restless 1 1  Easily annoyed or irritable 2 1  Afraid - awful might happen 0 0  Total GAD 7 Score 5 4  Anxiety Difficulty Somewhat difficult Not difficult at all       Assessment & Plan:  Assessment & Plan   ADHD (attention deficit hyperactivity disorder), combined type Assessment & Plan: Completed ADHD evaluation today in clinic. Based on evaluation today, patient meets criteria for this condition. Specifically, based on my evaluation today, LORELY BUBB primarily reporting hyperactive/inattentive symptoms. Further, I do not suspect underlying anxiety or mood disorder as primary driver or comorbid conditions accounting for pt's presenting symptoms at this time but will continue to reassess with each return visit and response to treatment.. Discussed adult ADHD and indications for pharmacologic treatment. Discussed behavioral modifications and provided helpful  websites on after visit summary including [employer] magazine and pomodoro timers. Reviewed all available pharmacologic treatment including amphetamine, methylphenadiate and non stimulant medications. After discussion, will opt to start long acting stimulant medication concerta 18mg . Reviewed expected side effects and encouraged pt to take break off medication at least 1-2x per week to avoid need for increased dosage adjustments. Answered all patient questions and concerns. No acute safety concerns. Return in 2 weeks to reassess. PDMP reviewed.   Orders: -     Methylphenidate HCl ER (OSM); Take 1 tablet (18 mg total) by mouth daily.  Dispense: 21 tablet; Refill: 0      Follow up plan: No follow-ups on file.  Hadassah Letters, MD     This visit was completed via video visit through MyChart due to the restrictions of the COVID-19 pandemic. All issues as above were discussed and addressed. Physical exam was done as above through visual confirmation on video through MyChart. If it was felt that the patient should be evaluated in the office, they were directed there. The patient verbally consented to this visit.  Location of the patient: home Location of the provider: work Those involved with this call:  Provider:  Geraldine Kling, MD CMA: Valla Gauss, CMA Time spent on call: 30 minutes with patient face to face via video conference. More than 50% of this time was spent in counseling and coordination of care. 10 minutes total spent in review of patient's record and preparation of their chart. Total time spent on this encounter: 40 minutes.

## 2023-07-30 ENCOUNTER — Encounter: Payer: Self-pay | Admitting: Pediatrics

## 2023-07-30 DIAGNOSIS — F902 Attention-deficit hyperactivity disorder, combined type: Secondary | ICD-10-CM | POA: Insufficient documentation

## 2023-07-30 MED ORDER — METHYLPHENIDATE HCL ER (OSM) 18 MG PO TBCR: 18.0000 mg | EXTENDED_RELEASE_TABLET | Freq: Every day | ORAL | 0 refills | Status: DC

## 2023-07-30 NOTE — Assessment & Plan Note (Signed)
 Completed ADHD evaluation today in clinic. Based on evaluation today, patient meets criteria for this condition. Specifically, based on my evaluation today, Jodi Chapman primarily reporting hyperactive/inattentive symptoms. Further, I do not suspect underlying anxiety or mood disorder as primary driver or comorbid conditions accounting for pt's presenting symptoms at this time but will continue to reassess with each return visit and response to treatment.. Discussed adult ADHD and indications for pharmacologic treatment. Discussed behavioral modifications and provided helpful websites on after visit summary including add[employer] magazine and pomodoro timers. Reviewed all available pharmacologic treatment including amphetamine, methylphenadiate and non stimulant medications. After discussion, will opt to start long acting stimulant medication concerta 18mg . Reviewed expected side effects and encouraged pt to take break off medication at least 1-2x per week to avoid need for increased dosage adjustments. Answered all patient questions and concerns. No acute safety concerns. Return in 2 weeks to reassess. PDMP reviewed.

## 2023-07-30 NOTE — Addendum Note (Signed)
 Addended by: Hadassah Letters on: 07/30/2023 11:04 AM   Modules accepted: Level of Service

## 2023-07-30 NOTE — Patient Instructions (Signed)
 Check out the website [employer] for ADHD resources and support. One other helpful tool with ADHD are tomato timers (promodoro timers) when trying to accomplish tasks. Check out an example at this website: https://www.tomatotimers.com/

## 2023-07-31 NOTE — Telephone Encounter (Signed)
 Contacted patient she states she just started medication today scheduled her for a follow up appointment 6/26

## 2023-08-03 IMAGING — US US MFM OB DETAIL+14 WK
1 series · 16 of 28 positions shown · non-contrast
Comparison: none

[Series 1: us mfm ob detail+14 wk · 16 of 154 slices shown]
[im 1/154]
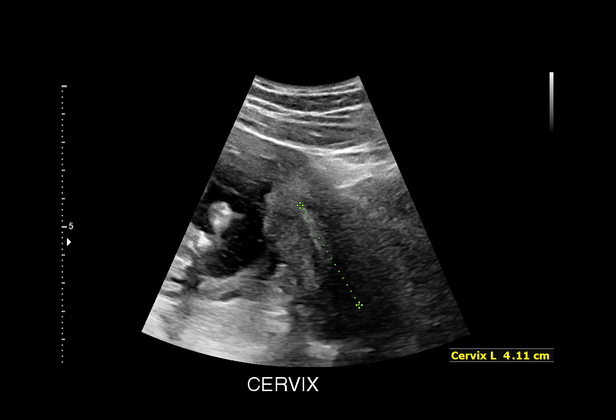
[im 12/154]
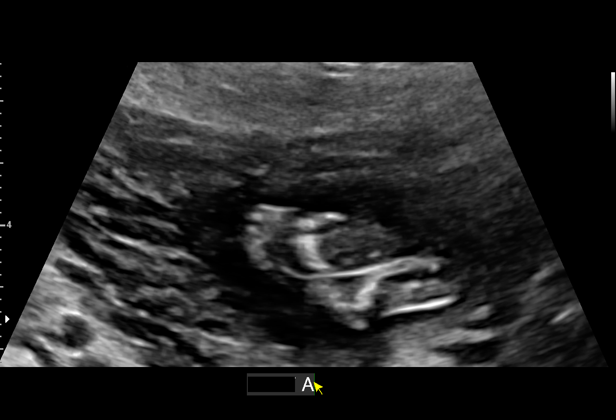
[im 23/154]
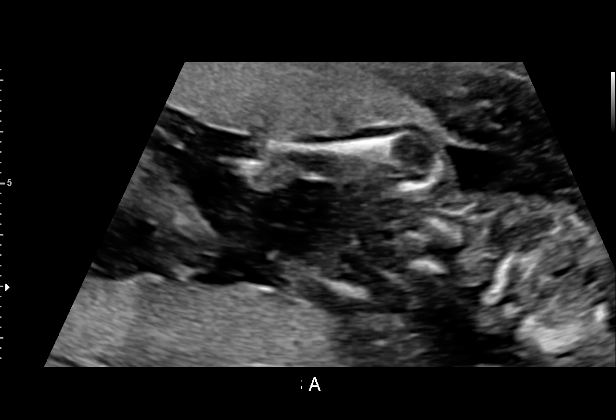
[im 35/154]
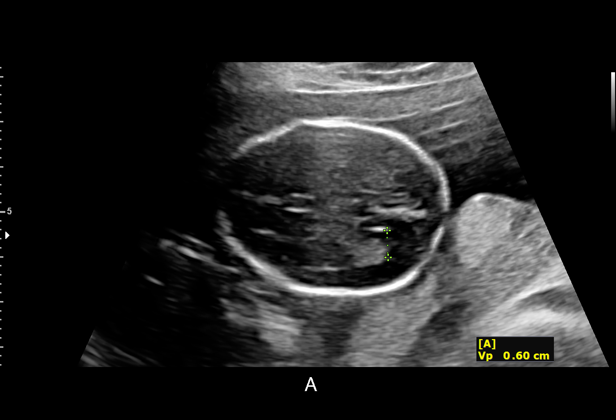
[im 40/154]
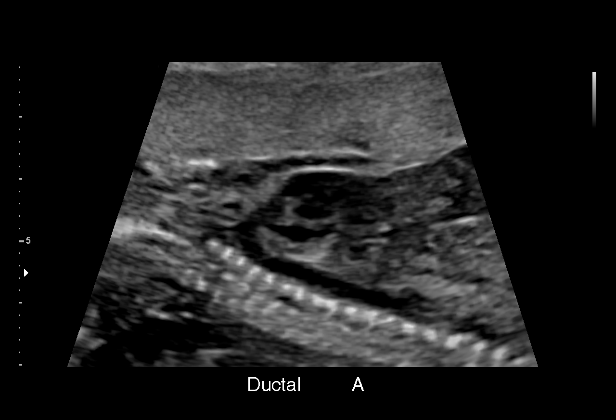
[im 52/154]
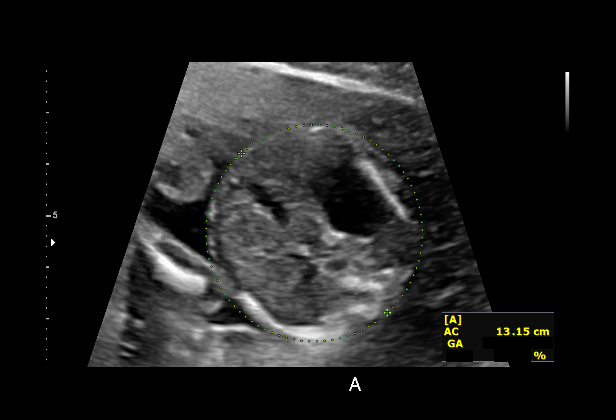
[im 63/154]
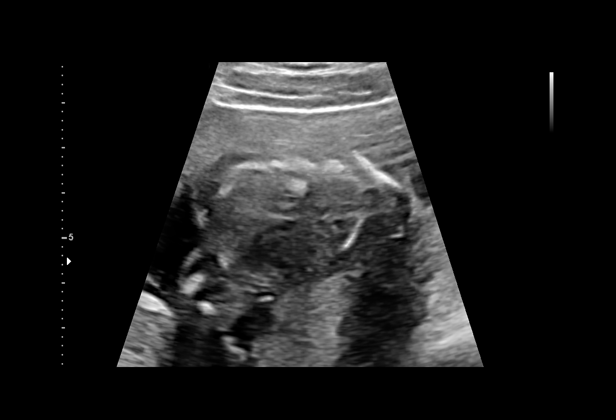
[im 74/154]
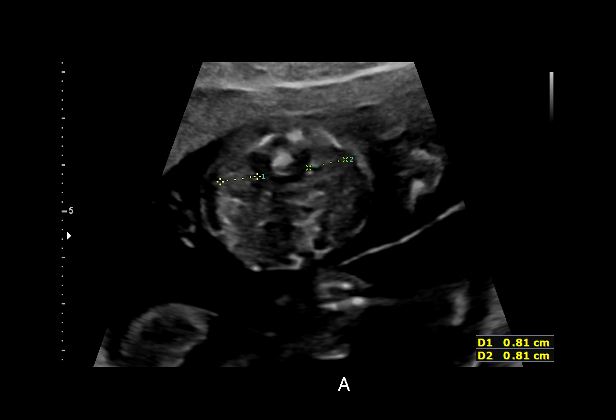
[im 80/154]
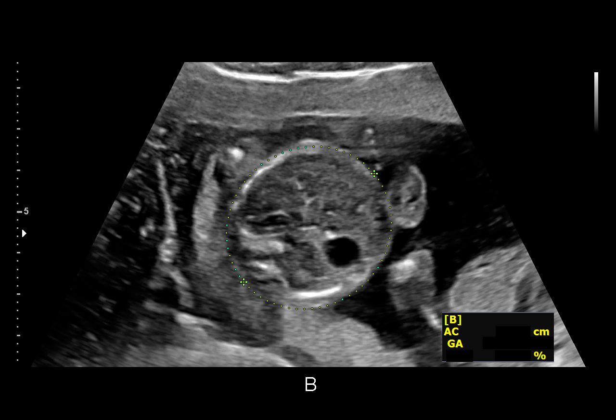
[im 91/154]
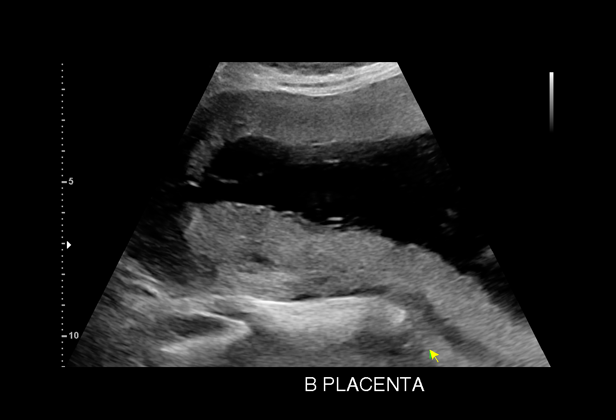
[im 103/154]
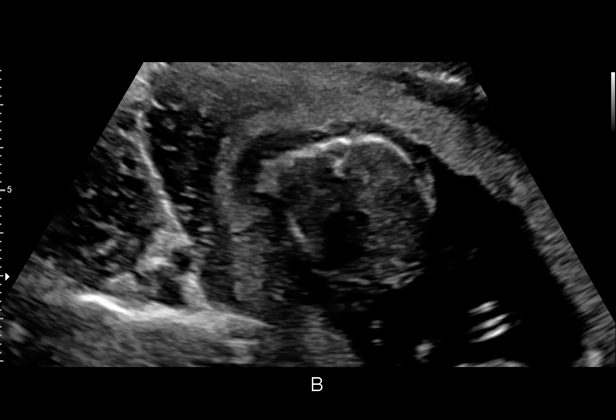
[im 114/154]
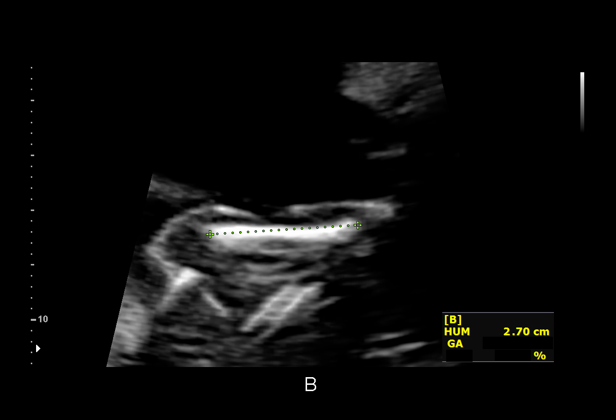
[im 120/154]
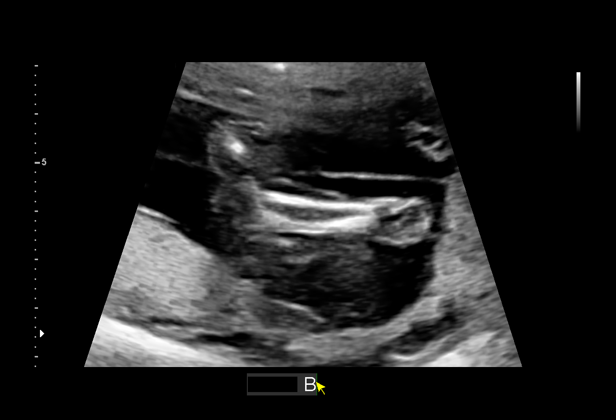
[im 131/154]
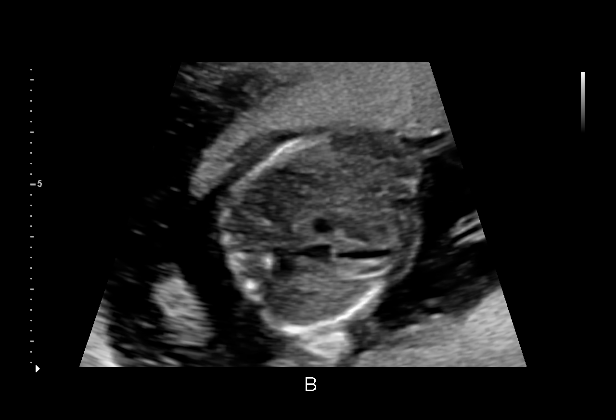
[im 142/154]
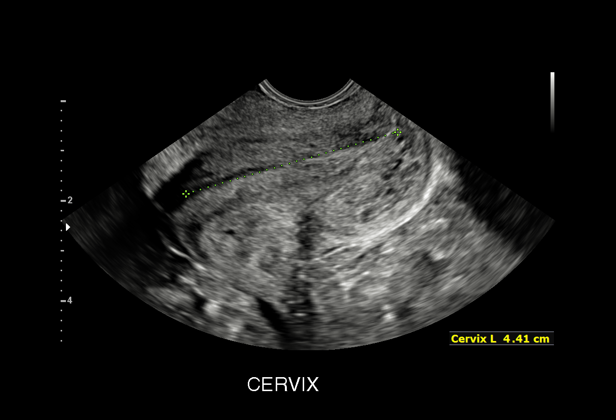
[im 154/154]
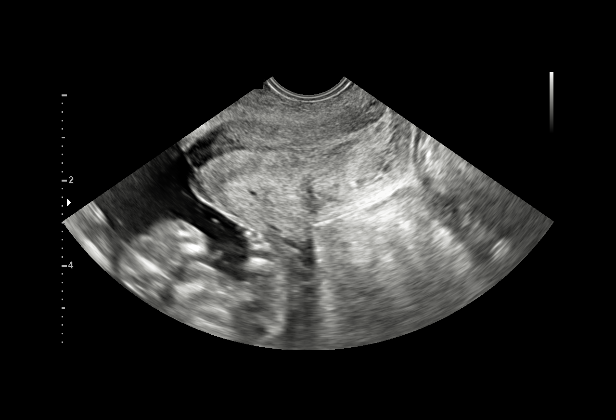

[16 of 28 positions shown; findings below may reference images not displayed]

[REDACTED]
                                                            [id number]

    +14 WK

Indications

 18 weeks gestation of pregnancy
 Twin pregnancy, didi second trimester
 Encounter for antenatal screening for
 malformations
Fetal Evaluation (Fetus A)

 Num Of Fetuses:         2
 Fetal Pole:             Visualized
 Fetal Heart Rate(bpm):  145
 Cardiac Activity:       Observed
 Fetal Lie:              Upper Fetus
 Presentation:           Breech
 Placenta:               Anterior
 P. Cord Insertion:      Visualized, central
 Membrane Desc:      Dividing Membrane seen - Dichorionic.

 Amniotic Fluid
 AFI FV:      Within normal limits

                             Largest Pocket(cm)

Biometry (Fetus A)

 BPD:      38.4  mm     G. Age:  17w 5d         16  %    CI:        68.66   %    70 - 86
                                                         FL/HC:      17.2   %    16.1 -
 HC:      148.1  mm     G. Age:  18w 0d         15  %    HC/AC:      1.16        1.09 -
 AC:       128   mm     G. Age:  18w 3d         39  %    FL/BPD:     66.1   %
 FL:       25.4  mm     G. Age:  17w 5d         15  %    FL/AC:      19.8   %    20 - 24
 HUM:      25.4  mm     G. Age:  18w 0d         35  %
 CER:      19.1  mm     G. Age:  18w 5d         49  %
 NFT:       4.6  mm
 LV:          6  mm
 CM:        3.9  mm

 Est. FW:     221  gm      0 lb 8 oz     18  %     FW Discordancy      0 \ 0 %
OB History

 Gravidity:    1
 Living:       0
Gestational Age (Fetus A)

 LMP:           19w 5d        Date:  05/28/20                 EDD:   03/04/21
 U/S Today:     18w 0d                                        EDD:   03/16/21
 Best:          18w 4d     Det. By:  Early Ultrasound         EDD:   03/12/21
                                     (08/30/20)
Anatomy (Fetus A)

 Cranium:               Appears normal         Aortic Arch:            Not well visualized
 Cavum:                 Appears normal         Ductal Arch:            Appears normal
 Ventricles:            Appears normal         Diaphragm:              Appears normal
 Choroid Plexus:        Appears normal         Stomach:                Appears normal, left
                                                                       sided
 Cerebellum:            Appears normal         Abdomen:                Appears normal
 Posterior Fossa:       Appears normal         Abdominal Wall:         Appears nml (cord
                                                                       insert, abd wall)
 Nuchal Fold:           Appears normal         Cord Vessels:           Appears normal (3
                                                                       vessel cord)
 Face:                  Appears normal         Kidneys:                Appear normal
                        (orbits and profile)
 Lips:                  Not well visualized    Bladder:                Appears normal
 Heart:                 Appears normal         Spine:                  Appears normal
                        (4CH, axis, and
                        situs)
 RVOT:                  Appears normal         Upper Extremities:      Appears normal
 LVOT:                  Not well visualized    Lower Extremities:      Appears normal

 Other:  Fetus appears to be female.
Targeted Anatomy (Fetus A)

 Thorax
 SVC:                   Appears normal         3 V Trachea View:       Not well visualized
 3 Vessel View:         Not well visualized    IVC:                    Appears normal
Fetal Evaluation (Fetus B)
 Num Of Fetuses:         2
 Fetal Heart Rate(bpm):  154
 Cardiac Activity:       Observed
 Fetal Lie:              Lower Fetus
 Presentation:           Breech
 Placenta:               Posterior
 P. Cord Insertion:      Visualized, central
 Membrane Desc:      Dividing Membrane seen - Dichorionic.

 Amniotic Fluid
 AFI FV:      Within normal limits

                             Largest Pocket(cm)

Biometry (Fetus B)

 BPD:        39  mm     G. Age:  17w 6d         21  %    CI:         70.4   %    70 - 86
                                                         FL/HC:      17.6   %    16.1 -
 HC:      148.2  mm     G. Age:  18w 0d         15  %    HC/AC:      1.18        1.09 -
 AC:      125.2  mm     G. Age:  18w 1d         32  %    FL/BPD:     66.9   %
 FL:       26.1  mm     G. Age:  18w 0d         21  %    FL/AC:      20.8   %    20 - 24
 HUM:      27.8  mm     G. Age:  18w 6d         62  %
 CER:        20  mm     G. Age:  19w 2d         79  %
 NFT:       4.6  mm
 LV:        5.4  mm
 CM:        4.1  mm

 Est. FW:     220  gm      0 lb 8 oz     17  %     FW Discordancy         0  %
Gestational Age (Fetus B)

 LMP:           19w 5d        Date:  05/28/20                 EDD:   03/04/21
 U/S Today:     18w 0d                                        EDD:   03/16/21
 Best:          18w 4d     Det. By:  Early Ultrasound         EDD:   03/12/21
                                     (08/30/20)
Anatomy (Fetus B)

 Cranium:               Appears normal         Aortic Arch:            Not well visualized
 Cavum:                 Appears normal         Ductal Arch:            Not well visualized
 Ventricles:            Appears normal         Diaphragm:              Appears normal
 Choroid Plexus:        Appears normal         Stomach:                Appears normal, left
                                                                       sided
 Cerebellum:            Appears normal         Abdomen:                Appears normal
 Posterior Fossa:       Appears normal         Abdominal Wall:         Appears nml (cord
                                                                       insert, abd wall)
 Nuchal Fold:           Appears normal         Cord Vessels:           Appears normal (3
                                                                       vessel cord)
 Face:                  Profile appears        Kidneys:                Appear normal
                        normal
 Lips:                  Not well visualized    Bladder:                Appears normal
 Heart:                 Not well visualized    Spine:                  Appears normal
 RVOT:                  Not well visualized    Upper Extremities:      Limited hands
 LVOT:                  Not well visualized    Lower Extremities:      Appears normal

 Other:  Fetus appears to be female.
Cervix Uterus Adnexa

 Cervix
 Length:           4.53  cm.
 Normal appearance by transvaginal scan

 Right Ovary
 Within normal limits.

 Left Ovary
 Within normal limits.
Impression

 Detailed anatomy for dichorionic diamnoitic twin pregnancy
 (early ultra sound demonstrated twin peak sign and thickend
 membrane)
 Twin A normal stomach, amniotic fluid, bladder and BPP
 18th%- Upper, anterior placenta
 Twin B normal stomach, amniotic fluid, bladder and EFW
 17th%- Lower posterior placenta.
 Twin discordance 0%

 Suboptimal views of the fetal anatomy was obtained
 secondary to fetal position and multiple gestation.

 Ms. Askar had a normal Mat 21 but was read as a
 singleton. I recommend repeating the cell free DNA perform
 through panorama.
Recommendations

 Follow up growth in 4 weeks.
 Consider repeat panorama given that the early Mat 21
 suggested singleton gestation.

## 2023-08-22 ENCOUNTER — Telehealth (INDEPENDENT_AMBULATORY_CARE_PROVIDER_SITE_OTHER): Payer: MEDICAID | Admitting: Pediatrics

## 2023-08-22 DIAGNOSIS — F902 Attention-deficit hyperactivity disorder, combined type: Secondary | ICD-10-CM | POA: Diagnosis not present

## 2023-08-22 MED ORDER — AMPHETAMINE-DEXTROAMPHET ER 10 MG PO CP24
10.0000 mg | ORAL_CAPSULE | Freq: Every day | ORAL | 0 refills | Status: DC
Start: 2023-08-22 — End: 2023-09-05

## 2023-08-22 NOTE — Patient Instructions (Signed)
 Start adderall 10mg  extended release daily as needed  The other options after this include: Non stimulant: Intuniv Kapvay Strattera Qelbree Other class (dopamine medication) Wellbutrin

## 2023-08-22 NOTE — Progress Notes (Signed)
 Telehealth Visit  I connected with  Jodi Chapman on 08/27/23 by a video enabled telemedicine application and verified that I am speaking with the correct person using two identifiers.   I discussed the limitations of evaluation and management by telemedicine. The patient expressed understanding and agreed to proceed.  Subjective:    Patient ID: Jodi Chapman, female    DOB: 1995-04-08, 28 y.o.   MRN: [id number]  HPI: Jodi Chapman is a 28 y.o. female  Chief Complaint  Patient presents with   ADHD    Discussed the use of AI scribe software for clinical note transcription with the patient, who gave verbal consent to proceed.  History of Present Illness   Jodi Chapman is a 28 year old female with ADHD who presents with concerns about her current medication, Concerta .  She has been experiencing issues with Concerta , including feeling spaced out and zoning out, requiring conscious effort to refocus. She also experiences episodes of pacing and uncertainty about where to start tasks, leading to a lack of motivation and inactivity.  The effects of Concerta  last until later in the evening, after which there is a significant drop in motivation and energy. She has attempted to take the medication a few times but felt shaky each time, prompting her to discontinue its use. After stopping Concerta , she felt somewhat back to her baseline but continued to struggle with the same issues as before.  She reports side effects including dry mouth and increased thirst, necessitating increased water intake. Despite these side effects, she maintains her appetite and is able to eat regularly, partly due to her routine of eating with her children.  No anxiety or insomnia while on Concerta . She reports feeling shaky and having a dry mouth, but no significant changes in appetite.     Relevant past medical, surgical, family and social history reviewed and updated as indicated. Interim  medical history since our last visit reviewed. Allergies and medications reviewed and updated.  ROS per HPI unless specifically indicated above     Objective:    LMP 08/11/2023 (Approximate)   Wt Readings from Last 3 Encounters:  07/04/23 146 lb 3.2 oz (66.3 kg)  02/05/23 141 lb 11.2 oz (64.3 kg)  01/27/23 148 lb 2.4 oz (67.2 kg)     Physical Exam Constitutional:      General: She is not in acute distress.    Appearance: Normal appearance.   Neurological:     General: No focal deficit present.     Mental Status: She is alert. Mental status is at baseline.     LIMITED EXAM GIVEN VIDEO VISIT     Assessment & Plan:  Assessment & Plan   ADHD (attention deficit hyperactivity disorder), combined type Assessment & Plan: Concerta  caused undesirable effects and prolonged action. Discussed alternatives, she chose Adderall extended release for flexible symptom management. Similar side effects will be monitored. - Prescribe Adderall extended release. - Provide 14 pills for trial before follow-up.  Orders: -     Amphetamine -Dextroamphet ER; Take 1 capsule (10 mg total) by mouth daily for 14 days.  Dispense: 14 capsule; Refill: 0     Follow up plan: Return in about 2 weeks (around 09/05/2023) for ADHD.  Hadassah SHAUNNA Nett, MD   This visit was completed via video visit through MyChart due to the restrictions of the COVID-19 pandemic. All issues as above were discussed and addressed. Physical exam was done as above through visual confirmation on video through  MyChart. If it was felt that the patient should be evaluated in the office, they were directed there. The patient verbally consented to this visit.  Location of the patient: home Location of the provider: work Those involved with this call:  Provider: Hadassah Nett, MD CMA: Cena Maffucci, CMA Time spent on call: 15 minutes with patient face to face via video conference. More than 50% of this time was spent in counseling and  coordination of care. 15 minutes total spent in review of patient's record and preparation of their chart. Total time spent on this encounter: 30 minutes.

## 2023-08-27 ENCOUNTER — Encounter: Payer: Self-pay | Admitting: Pediatrics

## 2023-08-27 NOTE — Assessment & Plan Note (Signed)
 Concerta  caused undesirable effects and prolonged action. Discussed alternatives, she chose Adderall extended release for flexible symptom management. Similar side effects will be monitored. - Prescribe Adderall extended release. - Provide 14 pills for trial before follow-up.

## 2023-09-05 ENCOUNTER — Telehealth: Payer: MEDICAID | Admitting: Pediatrics

## 2023-09-05 ENCOUNTER — Encounter: Payer: Self-pay | Admitting: Pediatrics

## 2023-09-05 DIAGNOSIS — F902 Attention-deficit hyperactivity disorder, combined type: Secondary | ICD-10-CM | POA: Diagnosis not present

## 2023-09-05 MED ORDER — AMPHETAMINE-DEXTROAMPHET ER 10 MG PO CP24: 10.0000 mg | ORAL_CAPSULE | Freq: Every day | ORAL | 0 refills | Status: DC | PRN

## 2023-09-05 NOTE — Assessment & Plan Note (Signed)
 Adderall XR 10 mg has improved symptoms without causing insomnia. Discussed the option of short-acting Adderall for added flexibility. Continue Adderall XR 10 mg daily in the morning after breakfast. Consider short-acting Adderall as needed. Provide prescription refills for two months. Schedule a follow-up in two months.
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
## 2023-11-07 ENCOUNTER — Telehealth: Payer: MEDICAID | Admitting: Pediatrics

## 2023-11-07 DIAGNOSIS — F902 Attention-deficit hyperactivity disorder, combined type: Secondary | ICD-10-CM

## 2023-11-07 DIAGNOSIS — J45909 Unspecified asthma, uncomplicated: Secondary | ICD-10-CM

## 2023-11-07 MED ORDER — AMPHETAMINE-DEXTROAMPHET ER 10 MG PO CP24: 10.0000 mg | ORAL_CAPSULE | Freq: Every day | ORAL | 0 refills | Status: AC | PRN

## 2023-11-07 MED ORDER — BUDESONIDE-FORMOTEROL FUMARATE 160-4.5 MCG/ACT IN AERO: 1.0000 | INHALATION_SPRAY | RESPIRATORY_TRACT | 3 refills | Status: AC | PRN

## 2023-11-07 NOTE — Progress Notes (Signed)
 Telehealth Visit  I connected with  Jodi Chapman on 11/19/23 by a video enabled telemedicine application and verified that I am speaking with the correct person using two identifiers.   I discussed the limitations of evaluation and management by telemedicine. The patient expressed understanding and agreed to proceed.  Subjective:    Patient ID: Jodi Chapman, female    DOB: [birth year], 28 y.o.   MRN: [id number]  HPI: Jodi Chapman is a 28 y.o. female  Chief Complaint  Patient presents with   ADHD   Medication Refill    SYMBICORT , ADDERALL     Discussed the use of AI scribe software for clinical note transcription with the patient, who gave verbal consent to proceed.  History of Present Illness   Jodi Chapman is a 28 year old female who presents for medication management and follow-up.  She finds her current medication regimen for ADHD effective, experiencing increased productivity and mental clarity. However, she feels groggy if she skips a day or two of the medication. She feels 'extra productive' and able to remember tasks better when on the medication, although the effects start to wear off around 5 PM. No issues with sleep are reported.  She uses Symbicort  for asthma management and finds it highly effective, especially when traveling to higher elevations such as [place] . She is planning a two-week trip to [place] , which includes activities such as hiking and living off a backpack, and anticipates a difference in her breathing due to the elevation.      Relevant past medical, surgical, family and social history reviewed and updated as indicated. Interim medical history since our last visit reviewed. Allergies and medications reviewed and updated.  ROS per HPI unless specifically indicated above     Objective:    There were no vitals taken for this visit.  Wt Readings from Last 3 Encounters:  07/04/23 146 lb 3.2 oz (66.3 kg)  02/05/23 141 lb 11.2 oz (64.3  kg)  01/27/23 148 lb 2.4 oz (67.2 kg)     Physical Exam Constitutional:      General: She is not in acute distress.    Appearance: Normal appearance.  Neurological:     General: No focal deficit present.     Mental Status: She is alert. Mental status is at baseline.      LIMITED EXAM GIVEN VIDEO VISIT     Assessment & Plan:  Assessment & Plan   ADHD (attention deficit hyperactivity disorder), combined type Assessment & Plan: Current medication effective with minor next-day grogginess. No sleep issues. Prefers to continue regimen. - Continue current ADHD medication regimen. - Provide refills for three months for upcoming trip. - Consider short-acting formulation if symptoms worsen.  Orders: -     Amphetamine -Dextroamphet ER; Take 1 capsule (10 mg total) by mouth daily as needed.  Dispense: 30 capsule; Refill: 0 -     Amphetamine -Dextroamphet ER; Take 1 capsule (10 mg total) by mouth daily as needed.  Dispense: 30 capsule; Refill: 0 -     Amphetamine -Dextroamphet ER; Take 1 capsule (10 mg total) by mouth daily as needed.  Dispense: 30 capsule; Refill: 0  Moderate asthma, unspecified whether complicated, unspecified whether persistent Assessment & Plan: Excellent symptom control with Symbicort , especially at higher elevations. No concerns. - Send year-long refills for Symbicort . - Ensure adequate inhalers for trip.   Orders: -     Budesonide -Formoterol  Fumarate; Inhale 1-2 puffs into the lungs as needed (1 to 2 puffs as  needed, MAX 12 puffs a day).  Dispense: 1 each; Refill: 3    Follow up plan: Return in about 3 months (around 02/06/2024).  Hadassah SHAUNNA Nett, MD   This visit was completed via video visit through MyChart due to the restrictions of the COVID-19 pandemic. All issues as above were discussed and addressed. Physical exam was done as above through visual confirmation on video through MyChart. If it was felt that the patient should be evaluated in the office, they  were directed there. The patient verbally consented to this visit.  Location of the patient: home Location of the provider: work Those involved with this call:  Provider: Hadassah Nett, MD CMA: Cena Maffucci, CMA Time spent on call: 15 minutes with patient face to face via video conference. More than 50% of this time was spent in counseling and coordination of care. 15 minutes total spent in review of patient's record and preparation of their chart. Total time spent on this encounter: 30 minutes.

## 2023-11-19 ENCOUNTER — Encounter: Payer: Self-pay | Admitting: Pediatrics

## 2023-11-19 NOTE — Assessment & Plan Note (Signed)
 Excellent symptom control with Symbicort , especially at higher elevations. No concerns. - Send year-long refills for Symbicort . - Ensure adequate inhalers for trip.

## 2023-11-19 NOTE — Assessment & Plan Note (Signed)
 Current medication effective with minor next-day grogginess. No sleep issues. Prefers to continue regimen. - Continue current ADHD medication regimen. - Provide refills for three months for upcoming trip. - Consider short-acting formulation if symptoms worsen.

## 2023-11-20 NOTE — Progress Notes (Signed)
 Scheduled

## 2024-02-25 ENCOUNTER — Ambulatory Visit: Payer: MEDICAID | Admitting: Pediatrics
